# Patient Record
Sex: Male | Born: 1980 | Race: White | Hispanic: No | Marital: Married | Smoking: Current every day smoker
Health system: Southern US, Community
[De-identification: ages and names within clinical notes are randomized; demographics above are authoritative.]

## PROBLEM LIST (undated history)

## (undated) DIAGNOSIS — N189 Chronic kidney disease, unspecified: Secondary | ICD-10-CM

## (undated) DIAGNOSIS — A419 Sepsis, unspecified organism: Secondary | ICD-10-CM

## (undated) DIAGNOSIS — E119 Type 2 diabetes mellitus without complications: Secondary | ICD-10-CM

## (undated) DIAGNOSIS — Z87442 Personal history of urinary calculi: Secondary | ICD-10-CM

## (undated) HISTORY — DX: Chronic kidney disease, unspecified: N18.9

## (undated) HISTORY — DX: Type 2 diabetes mellitus without complications: E11.9

## (undated) HISTORY — PX: LEG TENDON SURGERY: SHX1004

---

## 2002-11-15 ENCOUNTER — Emergency Department (HOSPITAL_COMMUNITY): Admission: EM | Admit: 2002-11-15 | Discharge: 2002-11-15 | Payer: Self-pay | Admitting: Emergency Medicine

## 2002-11-15 ENCOUNTER — Encounter: Payer: Self-pay | Admitting: Emergency Medicine

## 2005-12-29 ENCOUNTER — Ambulatory Visit: Payer: Self-pay | Admitting: Family Medicine

## 2007-05-31 ENCOUNTER — Ambulatory Visit: Payer: Self-pay | Admitting: Family Medicine

## 2007-05-31 DIAGNOSIS — Z9189 Other specified personal risk factors, not elsewhere classified: Secondary | ICD-10-CM | POA: Insufficient documentation

## 2007-05-31 DIAGNOSIS — R509 Fever, unspecified: Secondary | ICD-10-CM

## 2007-05-31 DIAGNOSIS — J020 Streptococcal pharyngitis: Secondary | ICD-10-CM

## 2007-05-31 LAB — CONVERTED CEMR LAB: Rapid Strep: POSITIVE

## 2007-06-02 ENCOUNTER — Telehealth: Payer: Self-pay | Admitting: Family Medicine

## 2010-08-29 NOTE — Assessment & Plan Note (Signed)
[facility] OFFICE NOTE   Maurice Morales, Maurice Morales                      MRN:          [id number]  DATE:12/29/2005                            DOB:          1981-01-09    This is a 30 year old gentleman here to establish with the practice.  He is  also for a complete physical examination.  He has a couple of problems to  discuss.  First off, over the last couple of years, he has had some  stiffness and mild pain in a lot of his joints, especially his knees and  back when he gets up in the mornings.  After a few hours it loosens up and  really does not bother him during the day.  He is still quite active and  plays softball.  He played a lot of sports when he was younger, including  football.  He finds that occasionally taking Tylenol or ibuprofen helps.  He  does not think that he needs any type of medication on a daily basis,  however.  Also, he tends to have nosebleeds off and on.  This could be in  the winter time or in the summer time.  No other particular problems.   PAST MEDICAL HISTORY:  It has been many years since he has seen a doctor.  He had chicken pox as a child, but no other significant medical problems.  He has never had a surgery.   ALLERGIES:  None.   CURRENT MEDICATIONS:  None.   HABITS:  He quit smoking 6 months ago, but does continue to chew tobacco.  He drinks some alcohol.   SOCIAL HISTORY:  He is married with 2 children.  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   FAMILY HISTORY:  Remarkable for numerous family members, including  grandparents, an uncle, and his father with COPD (all of these were  smokers).  Also some family history of diabetes and heart disease.   OBJECTIVE:  Height 6 feet, 3 inches.  Weight 280, BP 120/70, pulse 60 and  regular.  GENERAL:  He is a little overweight.  SKIN:  Clear.  EYES:  Clear.  NOSE:  Clear.  OROPHARYNX:  Clear.  NECK:   Supple without lymphadenopathy or masses.  LUNGS:  Clear.  CARDIAC:  Rate and rhythm are regular without gallops, murmurs, or rubs.  Distal pulses full.  ABDOMEN:  Soft.  Normal bowel sounds.  Nontender.  No masses.  GENITALIA:  Normal male.  He is circumcised.  EXTREMITIES:  No cyanosis, clubbing, or edema.  NEUROLOGIC:  Grossly intact.   ASSESSMENT AND PLAN:  Problem #1.  Physical exam.  He is fasting so I will  get the usual laboratories.  Will also try to change his diet and lose some  weight.  Problem #2.  Mild degenerative joint pains.  Losing weight will help.  He  can continue to use ibuprofen as needed.  Problem #3.  Epistaxis, probably due to dry nasal passages.  I suggested  saline nasal sprays several times a day.  Tera Mater. Clent Ridges, MD   SAF/MedQ  DD:  12/29/2005  DT:  12/30/2005  Job #:  [id number]

## 2010-12-17 ENCOUNTER — Encounter: Payer: Self-pay | Admitting: Family Medicine

## 2010-12-17 ENCOUNTER — Ambulatory Visit (INDEPENDENT_AMBULATORY_CARE_PROVIDER_SITE_OTHER): Payer: Managed Care, Other (non HMO) | Admitting: Family Medicine

## 2010-12-17 VITALS — BP 134/86 | HR 81 | Temp 99.6°F | Ht 74.25 in | Wt 275.0 lb

## 2010-12-17 DIAGNOSIS — Z0000 Encounter for general adult medical examination without abnormal findings: Secondary | ICD-10-CM

## 2010-12-17 MED ORDER — CYCLOBENZAPRINE HCL 10 MG PO TABS: 10.0000 mg | ORAL_TABLET | Freq: Three times a day (TID) | ORAL | Status: AC | PRN

## 2010-12-17 MED ORDER — PREDNISONE (PAK) 10 MG PO TABS: ORAL_TABLET | ORAL | Status: DC

## 2010-12-17 NOTE — Progress Notes (Signed)
  Subjective:    Patient ID: Maurice Morales, male    DOB: 01/31/1981, 30 y.o.   MRN: [id number]  HPI 30 yr old male to re-establish with [place] and for a cpx. He has felt well except for some low back pain that he woke up with about 3 weeks ago. His job is very physical, and he has been splitting some firewood at home lately. No specific injuries that he knows of. The pain is sharp, in the lower right back, and it radiates down the right leg. No numbness or weakness. Sitting makes it worse, walking or lying down makes it better. Using heat and Motrin with no relief.    Review of Systems  Constitutional: Negative.   HENT: Negative.   Eyes: Negative.   Respiratory: Negative.   Cardiovascular: Negative.   Gastrointestinal: Negative.   Genitourinary: Negative.   Musculoskeletal: Positive for back pain. Negative for myalgias, joint swelling, arthralgias and gait problem.  Skin: Negative.   Neurological: Negative.   Hematological: Negative.   Psychiatric/Behavioral: Negative.        Objective:   Physical Exam  Constitutional: He is oriented to person, place, and time. He appears well-developed and well-nourished. No distress.  HENT:  Head: Normocephalic and atraumatic.  Right Ear: External ear normal.  Left Ear: External ear normal.  Nose: Nose normal.  Mouth/Throat: Oropharynx is clear and moist. No oropharyngeal exudate.  Eyes: Conjunctivae and EOM are normal. Pupils are equal, round, and reactive to light. Right eye exhibits no discharge. Left eye exhibits no discharge. No scleral icterus.  Neck: Neck supple. No JVD present. No tracheal deviation present. No thyromegaly present.  Cardiovascular: Normal rate, regular rhythm, normal heart sounds and intact distal pulses.  Exam reveals no gallop and no friction rub.   No murmur heard. Pulmonary/Chest: Effort normal and breath sounds normal. No respiratory distress. He has no wheezes. He has no rales. He exhibits no tenderness.  Abdominal:  Soft. Bowel sounds are normal. He exhibits no distension and no mass. There is no tenderness. There is no rebound and no guarding.  Genitourinary: Rectum normal, prostate normal and penis normal. Guaiac negative stool. No penile tenderness.  Musculoskeletal: Normal range of motion. He exhibits no edema and no tenderness.       Lower spine has full ROM and negative SLR  Lymphadenopathy:    He has no cervical adenopathy.  Neurological: He is alert and oriented to person, place, and time. He has normal reflexes. No cranial nerve deficit. He exhibits normal muscle tone. Coordination normal.  Skin: Skin is warm and dry. No rash noted. He is not diaphoretic. No erythema. No pallor.  Psychiatric: He has a normal mood and affect. His behavior is normal. Judgment and thought content normal.          Assessment & Plan:  He needs to quit smoking and lose some weight. He will return soon for fasting labs. Try a steroid dose pack and muscle relaxers for the back. Recheck prn

## 2010-12-25 ENCOUNTER — Other Ambulatory Visit (INDEPENDENT_AMBULATORY_CARE_PROVIDER_SITE_OTHER): Payer: Managed Care, Other (non HMO)

## 2010-12-25 DIAGNOSIS — Z0000 Encounter for general adult medical examination without abnormal findings: Secondary | ICD-10-CM

## 2010-12-25 LAB — CBC WITH DIFFERENTIAL/PLATELET
Eosinophils Absolute: 0 10*3/uL (ref 0.0–0.7)
HCT: 50.4 % (ref 39.0–52.0)
Hemoglobin: 16.4 g/dL (ref 13.0–17.0)
MCV: 94.1 fl (ref 78.0–100.0)
Monocytes Absolute: 0.9 10*3/uL (ref 0.1–1.0)
Monocytes Relative: 7.6 % (ref 3.0–12.0)
Neutrophils Relative %: 83.2 % — ABNORMAL HIGH (ref 43.0–77.0)
Platelets: 305 10*3/uL (ref 150.0–400.0)
RBC: 5.35 Mil/uL (ref 4.22–5.81)
RDW: 13.7 % (ref 11.5–14.6)
WBC: 11.3 10*3/uL — ABNORMAL HIGH (ref 4.5–10.5)

## 2010-12-25 LAB — TSH: TSH: 0.66 u[IU]/mL (ref 0.35–5.50)

## 2010-12-25 LAB — HEPATIC FUNCTION PANEL
ALT: 43 U/L (ref 0–53)
AST: 24 U/L (ref 0–37)
Alkaline Phosphatase: 85 U/L (ref 39–117)
Bilirubin, Direct: 0 mg/dL (ref 0.0–0.3)

## 2010-12-25 LAB — POCT URINALYSIS DIPSTICK
Bilirubin, UA: NEGATIVE
Glucose, UA: NEGATIVE
Leukocytes, UA: NEGATIVE
Protein, UA: NEGATIVE
Spec Grav, UA: 1.02
Urobilinogen, UA: 0.2

## 2010-12-25 LAB — BASIC METABOLIC PANEL
CO2: 31 mEq/L (ref 19–32)
Creatinine, Ser: 1.1 mg/dL (ref 0.4–1.5)
GFR: 84.87 mL/min (ref >60.00)
Glucose, Bld: 89 mg/dL (ref 70–99)

## 2010-12-25 LAB — LIPID PANEL
HDL: 44.3 mg/dL (ref >39.00)
Total CHOL/HDL Ratio: 4
VLDL: 12 mg/dL (ref 0.0–40.0)

## 2010-12-31 NOTE — Progress Notes (Signed)
Quick Note:  Pt aware ______ 

## 2013-03-03 ENCOUNTER — Ambulatory Visit (HOSPITAL_COMMUNITY): Payer: Managed Care, Other (non HMO) | Attending: Family Medicine

## 2013-03-03 ENCOUNTER — Ambulatory Visit (INDEPENDENT_AMBULATORY_CARE_PROVIDER_SITE_OTHER): Payer: Managed Care, Other (non HMO) | Admitting: Family Medicine

## 2013-03-03 ENCOUNTER — Encounter: Payer: Self-pay | Admitting: Family Medicine

## 2013-03-03 VITALS — BP 130/70 | HR 67 | Temp 98.3°F | Wt 280.0 lb

## 2013-03-03 DIAGNOSIS — M79609 Pain in unspecified limb: Secondary | ICD-10-CM

## 2013-03-03 DIAGNOSIS — R229 Localized swelling, mass and lump, unspecified: Secondary | ICD-10-CM

## 2013-03-03 DIAGNOSIS — S8010XA Contusion of unspecified lower leg, initial encounter: Secondary | ICD-10-CM

## 2013-03-03 DIAGNOSIS — M7989 Other specified soft tissue disorders: Secondary | ICD-10-CM | POA: Insufficient documentation

## 2013-03-03 DIAGNOSIS — X58XXXA Exposure to other specified factors, initial encounter: Secondary | ICD-10-CM | POA: Insufficient documentation

## 2013-03-03 DIAGNOSIS — S8011XA Contusion of right lower leg, initial encounter: Secondary | ICD-10-CM

## 2013-03-03 DIAGNOSIS — E669 Obesity, unspecified: Secondary | ICD-10-CM | POA: Insufficient documentation

## 2013-03-03 DIAGNOSIS — F17200 Nicotine dependence, unspecified, uncomplicated: Secondary | ICD-10-CM | POA: Insufficient documentation

## 2013-03-03 NOTE — Progress Notes (Signed)
  Subjective:    Patient ID: Maurice Morales, male    DOB: 1981/03/20, 32 y.o.   MRN: [id number]  HPI Here to check his right lower leg. He was struck there by a softball and it immediately swelled up. He still has a large hematoma at the site, but the entire lower leg is also swollen and painful. He has been up working on it every day. No chest pain or SOB.    Review of Systems  Constitutional: Negative.   Respiratory: Negative.   Cardiovascular: Positive for leg swelling. Negative for chest pain and palpitations.       Objective:   Physical Exam  Constitutional: He appears well-developed and well-nourished.  Musculoskeletal:  There is a large firm non-tender hematoma over the right shin. The entire right lower leg is mildly swollen and tender, especially over the calf. There is faint ecchymosis down the leg to the foot           Assessment & Plan:  He had a contusion causing a lot of subcutaneous bleeding. This has formed a hematoma and he probably had a slow leakage of blood for a week or so cauing the lower leg to swell. We will get a venous doppler to rule out a DVT today however.

## 2013-03-03 NOTE — Progress Notes (Signed)
Pre visit review using our clinic review tool, if applicable. No additional management support is needed unless otherwise documented below in the visit note. 

## 2013-05-19 ENCOUNTER — Ambulatory Visit (INDEPENDENT_AMBULATORY_CARE_PROVIDER_SITE_OTHER): Payer: Managed Care, Other (non HMO) | Admitting: Family Medicine

## 2013-05-19 ENCOUNTER — Encounter: Payer: Self-pay | Admitting: Family Medicine

## 2013-05-19 VITALS — BP 122/70 | HR 84 | Ht 74.25 in | Wt 272.0 lb

## 2013-05-19 DIAGNOSIS — M5442 Lumbago with sciatica, left side: Secondary | ICD-10-CM

## 2013-05-19 DIAGNOSIS — M5430 Sciatica, unspecified side: Secondary | ICD-10-CM

## 2013-05-19 MED ORDER — HYDROCODONE-ACETAMINOPHEN 10-325 MG PO TABS: 1.0000 | ORAL_TABLET | Freq: Four times a day (QID) | ORAL | Status: DC | PRN

## 2013-05-19 MED ORDER — DICLOFENAC SODIUM 75 MG PO TBEC: 75.0000 mg | DELAYED_RELEASE_TABLET | Freq: Two times a day (BID) | ORAL | Status: DC

## 2013-05-19 MED ORDER — CYCLOBENZAPRINE HCL 10 MG PO TABS: 10.0000 mg | ORAL_TABLET | Freq: Three times a day (TID) | ORAL | Status: DC | PRN

## 2013-05-19 NOTE — Progress Notes (Signed)
Pre visit review using our clinic review tool, if applicable. No additional management support is needed unless otherwise documented below in the visit note. 

## 2013-05-19 NOTE — Progress Notes (Signed)
   Subjective:    Patient ID: Maurice Morales, male    DOB: Oct 01, 1980, 33 y.o.   MRN: [id number]  HPI Here for 2 months of sharp pains in the left lower back that shoot through the left buttock down to the left knee. His left foot and leg get numb at times. No hx of trauma. He notes that his mother and sister both developed degenerative disc problems in their 8440s. He has tried heat and Aleve.    Review of Systems  Constitutional: Negative.   Musculoskeletal: Positive for back pain.       Objective:   Physical Exam  Constitutional: He appears well-developed and well-nourished.  Musculoskeletal:  Tender in the left lower back and over the left sciatic notch. Full ROM of the spine, negative SLR          Assessment & Plan:  Probable degenerative disc disease. Start on Diclofenac and Flexeril, use Norco prn. Suggested he see a chiropractor

## 2013-05-22 ENCOUNTER — Telehealth: Payer: Self-pay | Admitting: Family Medicine

## 2013-05-22 NOTE — Telephone Encounter (Signed)
Relevant patient education mailed to patient.  

## 2014-08-11 ENCOUNTER — Encounter: Payer: Self-pay | Admitting: Internal Medicine

## 2014-08-11 ENCOUNTER — Ambulatory Visit (INDEPENDENT_AMBULATORY_CARE_PROVIDER_SITE_OTHER): Payer: Managed Care, Other (non HMO) | Admitting: Internal Medicine

## 2014-08-11 VITALS — BP 110/80 | Temp 98.8°F | Wt 283.0 lb

## 2014-08-11 DIAGNOSIS — S86911A Strain of unspecified muscle(s) and tendon(s) at lower leg level, right leg, initial encounter: Secondary | ICD-10-CM

## 2014-08-11 NOTE — Assessment & Plan Note (Signed)
No evidence of ligament or meniscus injury I suspect underlying arthritis Discussed NSAIDs before playing, quad strengthening, etc

## 2014-08-11 NOTE — Patient Instructions (Signed)
Please continue aleve 2 twice a day until the pain is better. Ice at the end of the day when stiffness comes back Work on Dance movement psychotherapistquad strengthening

## 2014-08-11 NOTE — Progress Notes (Signed)
Pre visit review using our clinic review tool, if applicable. No additional management support is needed unless otherwise documented below in the visit note. 

## 2014-08-11 NOTE — Progress Notes (Signed)
   Subjective:    Patient ID: Maurice Morales, male    DOB: 1980-05-05, 34 y.o.   MRN: [id number]  HPI Here due to right knee pain  5 nights ago after softball game--drove 30 miles and noted the knee locked when he got up Since then--it is tight every morning Hard to walk on it and worsens as the day goes on Very painful  Doesn't remember any injury--but did do a lot of running that night  Tried knee brace--bars on side with velcro Tried heat patches--doesn't seem to have helped Iced at night 2 aleve daily and ibuprofen 400mg  bid  Does feel better keeping his feet up  No current outpatient prescriptions on file prior to visit.   No current facility-administered medications on file prior to visit.    No Known Allergies  No past medical history on file.  No past surgical history on file.  Family History  Problem Relation Age of Onset  . COPD Father   . COPD Paternal Uncle   . Diabetes Maternal Grandmother   . COPD Paternal Grandfather     History   Social History  . Marital Status: Married    Spouse Name: N/A  . Number of Children: N/A  . Years of Education: N/A   Occupational History  . Not on file.   Social History Main Topics  . Smoking status: Current Every Day Smoker -- 0.50 packs/day for 10 years    Types: Cigarettes  . Smokeless tobacco: Never Used  . Alcohol Use: 0.0 oz/week     Comment: occ  . Drug Use: No  . Sexual Activity: Not on file   Other Topics Concern  . Not on file   Social History Narrative   Review of Systems No fever Doesn't feel sick but just some mild sinus symptoms    Objective:   Physical Exam  Constitutional: He appears well-developed and well-nourished. No distress.  Musculoskeletal:  No sig right knee effusion No ligament findings MacMurray's negative Sig crepitus with ROM  Neurological:  Stiff legged in right leg but full weight bearing with gait          Assessment & Plan:

## 2016-09-10 ENCOUNTER — Telehealth: Payer: Self-pay | Admitting: Family Medicine

## 2016-09-10 NOTE — Telephone Encounter (Signed)
Pt last seen on 05-2013 and would like to re-est . Pt is having back pain

## 2016-09-11 NOTE — Telephone Encounter (Signed)
Yes I can see him again  

## 2016-09-11 NOTE — Telephone Encounter (Signed)
lmom for pt to call back

## 2016-09-15 NOTE — Telephone Encounter (Signed)
lmom for pt to call back

## 2016-09-17 NOTE — Telephone Encounter (Signed)
lmom for pt to call back

## 2017-05-06 ENCOUNTER — Telehealth: Payer: Self-pay | Admitting: Family Medicine

## 2017-05-06 NOTE — Telephone Encounter (Signed)
Pt wife was in today to see Dr. Clent RidgesFry and wanted to know if Dr. Clent RidgesFry would consider seeing her husband again?  She is well aware that Dr. Clent RidgesFry is not taking newpt.

## 2017-05-06 NOTE — Telephone Encounter (Signed)
Yes I can see him again  

## 2017-05-06 NOTE — Telephone Encounter (Signed)
Sent to PCP for consideration

## 2017-05-07 NOTE — Telephone Encounter (Signed)
calle dboth pt and wife to advise that Dr. Clent RidgesFry will take on Pjilip as a new pt.

## 2017-06-01 ENCOUNTER — Encounter: Payer: Self-pay | Admitting: Family Medicine

## 2017-06-01 ENCOUNTER — Ambulatory Visit: Payer: Managed Care, Other (non HMO) | Admitting: Family Medicine

## 2017-06-01 ENCOUNTER — Ambulatory Visit (INDEPENDENT_AMBULATORY_CARE_PROVIDER_SITE_OTHER)
Admission: RE | Admit: 2017-06-01 | Discharge: 2017-06-01 | Disposition: A | Discharge status: Home / Self Care | Payer: Managed Care, Other (non HMO) | Source: Ambulatory Visit | Attending: Family Medicine | Admitting: Family Medicine

## 2017-06-01 VITALS — BP 124/82 | HR 81 | Temp 99.2°F | Ht 73.5 in | Wt 284.2 lb

## 2017-06-01 DIAGNOSIS — M546 Pain in thoracic spine: Secondary | ICD-10-CM

## 2017-06-01 DIAGNOSIS — G8929 Other chronic pain: Secondary | ICD-10-CM

## 2017-06-01 DIAGNOSIS — M545 Low back pain, unspecified: Secondary | ICD-10-CM

## 2017-06-01 DIAGNOSIS — M542 Cervicalgia: Secondary | ICD-10-CM

## 2017-06-01 DIAGNOSIS — Z0000 Encounter for general adult medical examination without abnormal findings: Secondary | ICD-10-CM | POA: Diagnosis not present

## 2017-06-01 DIAGNOSIS — Z0001 Encounter for general adult medical examination with abnormal findings: Secondary | ICD-10-CM | POA: Diagnosis not present

## 2017-06-01 DIAGNOSIS — R739 Hyperglycemia, unspecified: Secondary | ICD-10-CM | POA: Diagnosis not present

## 2017-06-01 LAB — HEPATIC FUNCTION PANEL
ALT: 55 U/L — AB (ref 0–53)
AST: 31 U/L (ref 0–37)
Albumin: 4.3 g/dL (ref 3.5–5.2)
Alkaline Phosphatase: 90 U/L (ref 39–117)
Bilirubin, Direct: 0.1 mg/dL (ref 0.0–0.3)
TOTAL PROTEIN: 7.3 g/dL (ref 6.0–8.3)
Total Bilirubin: 0.6 mg/dL (ref 0.2–1.2)

## 2017-06-01 LAB — CBC WITH DIFFERENTIAL/PLATELET
BASOS ABS: 0 10*3/uL (ref 0.0–0.1)
Basophils Relative: 0.5 % (ref 0.0–3.0)
Eosinophils Absolute: 0.2 10*3/uL (ref 0.0–0.7)
Eosinophils Relative: 2.4 % (ref 0.0–5.0)
HCT: 48 % (ref 39.0–52.0)
HEMOGLOBIN: 16.1 g/dL (ref 13.0–17.0)
Lymphocytes Relative: 18.3 % (ref 12.0–46.0)
Lymphs Abs: 1.7 10*3/uL (ref 0.7–4.0)
MCHC: 33.6 g/dL (ref 30.0–36.0)
MCV: 90.9 fl (ref 78.0–100.0)
MONO ABS: 0.9 10*3/uL (ref 0.1–1.0)
Monocytes Relative: 9.8 % (ref 3.0–12.0)
Neutro Abs: 6.4 10*3/uL (ref 1.4–7.7)
Neutrophils Relative %: 69 % (ref 43.0–77.0)
PLATELETS: 307 10*3/uL (ref 150.0–400.0)
RBC: 5.28 Mil/uL (ref 4.22–5.81)
RDW: 13.4 % (ref 11.5–15.5)
WBC: 9.3 10*3/uL (ref 4.0–10.5)

## 2017-06-01 LAB — LDL CHOLESTEROL, DIRECT: LDL DIRECT: 128 mg/dL

## 2017-06-01 LAB — BASIC METABOLIC PANEL
BUN: 17 mg/dL (ref 6–23)
CALCIUM: 9.7 mg/dL (ref 8.4–10.5)
CO2: 29 meq/L (ref 19–32)
Chloride: 100 mEq/L (ref 96–112)
Creatinine, Ser: 1.09 mg/dL (ref 0.40–1.50)
GFR: 80.09 mL/min (ref >60.00)
GLUCOSE: 139 mg/dL — AB (ref 70–99)
Potassium: 4.2 mEq/L (ref 3.5–5.1)
Sodium: 137 mEq/L (ref 135–145)

## 2017-06-01 LAB — LIPID PANEL
CHOLESTEROL: 193 mg/dL (ref 0–200)
HDL: 29.4 mg/dL — AB (ref >39.00)
NonHDL: 163.12
Total CHOL/HDL Ratio: 7
Triglycerides: 311 mg/dL — ABNORMAL HIGH (ref 0.0–149.0)
VLDL: 62.2 mg/dL — ABNORMAL HIGH (ref 0.0–40.0)

## 2017-06-01 LAB — POC URINALSYSI DIPSTICK (AUTOMATED)
Bilirubin, UA: NEGATIVE
Glucose, UA: NEGATIVE
Ketones, UA: NEGATIVE
Leukocytes, UA: NEGATIVE
Nitrite, UA: NEGATIVE
RBC UA: NEGATIVE
Spec Grav, UA: >=1.03 — AB (ref 1.010–1.025)
Urobilinogen, UA: 0.2 E.U./dL
pH, UA: 6 (ref 5.0–8.0)

## 2017-06-01 LAB — TSH: TSH: 2.14 u[IU]/mL (ref 0.35–4.50)

## 2017-06-01 MED ORDER — DICLOFENAC SODIUM 75 MG PO TBEC
75.0000 mg | DELAYED_RELEASE_TABLET | Freq: Two times a day (BID) | ORAL | 2 refills | Status: DC
Start: 2017-06-01 — End: 2017-07-05

## 2017-06-01 MED ORDER — CYCLOBENZAPRINE HCL 10 MG PO TABS: 10.0000 mg | ORAL_TABLET | Freq: Three times a day (TID) | ORAL | 2 refills | Status: DC | PRN

## 2017-06-01 NOTE — Progress Notes (Signed)
   Subjective:    Patient ID: Maurice Morales, male    DOB: 1980/06/22, 37 y.o.   MRN: [id number]  HPI Here to re-establish and for a well exam. He feels well except for nagging stiffness and pain in the neck and back that started about 5 years ago. He is very stiff when he gets up in the morning, and his back pops and cracks until he loosens up. The pain runs from the neck all the way down to the lower back. No radiation to the arms or legs. He takes Ibuprofen at times.    Review of Systems  Constitutional: Negative.   HENT: Negative.   Eyes: Negative.   Respiratory: Negative.   Cardiovascular: Negative.   Gastrointestinal: Negative.   Genitourinary: Negative.   Musculoskeletal: Positive for back pain, neck pain and neck stiffness. Negative for arthralgias and joint swelling.  Skin: Negative.   Neurological: Negative.   Psychiatric/Behavioral: Negative.        Objective:   Physical Exam  Constitutional: He is oriented to person, place, and time. He appears well-developed and well-nourished. No distress.  HENT:  Head: Normocephalic and atraumatic.  Right Ear: External ear normal.  Left Ear: External ear normal.  Nose: Nose normal.  Mouth/Throat: Oropharynx is clear and moist. No oropharyngeal exudate.  Eyes: Conjunctivae and EOM are normal. Pupils are equal, round, and reactive to light. Right eye exhibits no discharge. Left eye exhibits no discharge. No scleral icterus.  Neck: Neck supple. No JVD present. No tracheal deviation present. No thyromegaly present.  Cardiovascular: Normal rate, regular rhythm, normal heart sounds and intact distal pulses. Exam reveals no gallop and no friction rub.  No murmur heard. Pulmonary/Chest: Effort normal and breath sounds normal. No respiratory distress. He has no wheezes. He has no rales. He exhibits no tenderness.  Abdominal: Soft. Bowel sounds are normal. He exhibits no distension and no mass. There is no tenderness. There is no rebound and  no guarding.  Genitourinary: Rectum normal, prostate normal and penis normal. Rectal exam shows guaiac negative stool. No penile tenderness.  Musculoskeletal: Normal range of motion. He exhibits no edema or tenderness.  Lymphadenopathy:    He has no cervical adenopathy.  Neurological: He is alert and oriented to person, place, and time. He has normal reflexes. No cranial nerve deficit. He exhibits normal muscle tone. Coordination normal.  Skin: Skin is warm and dry. No rash noted. He is not diaphoretic. No erythema. No pallor.  Psychiatric: He has a normal mood and affect. His behavior is normal. Judgment and thought content normal.          Assessment & Plan:  Well exam. We discussed diet and exercise get fasting labs. For the back pain we will set up Xrays of the entire spine. He will try Diclofenac and Flexeril for pain relief. Gershon CraneStephen Elesha Thedford, MD

## 2017-06-02 NOTE — Addendum Note (Signed)
Addended by: Gershon CraneFRY, Bhavya Eschete A on: 06/02/2017 08:14 AM   Modules accepted: Orders

## 2017-06-03 ENCOUNTER — Telehealth: Payer: Self-pay | Admitting: Family Medicine

## 2017-06-03 LAB — HEMOGLOBIN A1C: Hgb A1c MFr Bld: 6.5 % (ref 4.6–6.5)

## 2017-06-03 NOTE — Telephone Encounter (Signed)
Pt given results per Dr Gershon CraneStephen Fry, " Normal except his cholesterol is high and he appears to have type 2 diabetes. He needs to have an A1c to confirm. Please call the lab to see if they can run this off the sample from yesterday (I will put in the order) or else he can come back to be drawn"; spoke with Nettie ElmSylvia at [facility] and she confirms that the lab could be obtained from samples collected on 06/01/17; pt verbalizes understanding and states that [facility] [place]; pt an be contacted at [phone]; unable to chart in result note because not routed to [facility] pool.

## 2017-06-03 NOTE — Addendum Note (Signed)
Addended by: Charna ElizabethLEMMONS, STEPHANIE L on: 06/03/2017 10:18 AM   Modules accepted: Orders

## 2017-06-16 ENCOUNTER — Telehealth: Payer: Self-pay | Admitting: Family Medicine

## 2017-06-16 DIAGNOSIS — M542 Cervicalgia: Secondary | ICD-10-CM

## 2017-06-16 DIAGNOSIS — M546 Pain in thoracic spine: Secondary | ICD-10-CM

## 2017-06-16 DIAGNOSIS — M5440 Lumbago with sciatica, unspecified side: Secondary | ICD-10-CM

## 2017-06-16 DIAGNOSIS — G8929 Other chronic pain: Secondary | ICD-10-CM

## 2017-06-16 NOTE — Telephone Encounter (Signed)
I recommend he see a Sports Medicine specialist. If he wishes I can set up a referral

## 2017-06-16 NOTE — Telephone Encounter (Signed)
Copied from CRM [id number]. Topic: General - Other >> Jun 16, 2017 10:32 AM Cecelia ByarsGreen, Temeka L, RMA wrote: Reason for CRM: Patient is calling to notify Dr. Clent RidgesFry that medication prescribed diclofenac (VOLTAREN) 75 MG EC tablet is not working and would like a call back concerning what he should do

## 2017-06-16 NOTE — Telephone Encounter (Signed)
Sent to PCP to advise 

## 2017-06-16 NOTE — Telephone Encounter (Signed)
Called pt and left a detailed VM to call back if they wish for referral to be set up.

## 2017-06-17 NOTE — Telephone Encounter (Signed)
Sent to PCP to set up referral to see Sports Medicine specialist.   Copied from CRM [phone]. Topic: Referral - Request >> Jun 16, 2017  2:58 PM Maurice Morales, Maurice Morales wrote: Reason for CRM: Patient returned [name]'s call concerning the referral. Patient states that he would like the referral placed.       Thank You!!!

## 2017-06-17 NOTE — Telephone Encounter (Signed)
The referral was done  

## 2017-06-18 NOTE — Telephone Encounter (Signed)
Called and spoke with pt. Pt advised and voiced understanding.  

## 2017-06-28 ENCOUNTER — Ambulatory Visit: Payer: Managed Care, Other (non HMO) | Admitting: Sports Medicine

## 2017-07-01 ENCOUNTER — Ambulatory Visit: Payer: Managed Care, Other (non HMO) | Admitting: Sports Medicine

## 2017-07-05 ENCOUNTER — Ambulatory Visit (INDEPENDENT_AMBULATORY_CARE_PROVIDER_SITE_OTHER): Payer: Managed Care, Other (non HMO) | Admitting: Sports Medicine

## 2017-07-05 ENCOUNTER — Encounter: Payer: Self-pay | Admitting: Sports Medicine

## 2017-07-05 VITALS — BP 146/83 | Ht 75.0 in | Wt 280.0 lb

## 2017-07-05 DIAGNOSIS — G8929 Other chronic pain: Secondary | ICD-10-CM | POA: Diagnosis not present

## 2017-07-05 DIAGNOSIS — M549 Dorsalgia, unspecified: Secondary | ICD-10-CM | POA: Diagnosis not present

## 2017-07-05 MED ORDER — MELOXICAM 15 MG PO TABS: ORAL_TABLET | ORAL | 0 refills | Status: DC

## 2017-07-05 NOTE — Assessment & Plan Note (Signed)
This back pain is likely due in part to the mild arthritis demonstrated on spinal xrays. 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 He also has a rounded shoulder posture which would be exacerbated by looking downward for machine operation. We will try meloxicam for symptom relief as diclofenac was ineffective and provide exercise instructions for scapular stabilization. He should follow up in 4-6 weeks, if symptoms are not improving with conservative care by that time we can discuss MRI for possible ESI or formal PT.

## 2017-07-05 NOTE — Progress Notes (Signed)
HPI  Maurice Morales is here today with several years of upper back pain that he discussed with his PCP Dr. Clent Ridges who referred him for Sports Medicine evaluation. This back pain is localized from the mid back to below the neck. He describes feeling like a tight knot that is relieved with stretching and popping his neck and back. 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 He was prescribed diclofenac and flexeril and felt no benefit with the diclofenac but relief helping him sleep through the night with the flexeril. 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 He also has intermittent low back pain that can radiate to his left knee but never further and does not persist. He denies any problems in his arms or with weakness or numbness. He does not recall any previous trauma or injury.  CC: Upper back pain  Medications/Interventions Tried: diclofenac, flexeril  See HPI and/or previous note for associated ROS.  Family History  Problem Relation Age of Onset  . COPD Father   . COPD Paternal Uncle   . Diabetes Maternal Grandmother   . COPD Paternal Grandfather    Social History   Socioeconomic History  . Marital status: Married    Spouse name: Not on file  . Number of children: Not on file  . Years of education: Not on file  . Highest education level: Not on file  Occupational History  . Forklift operator  Tobacco Use  . Smoking status: Current Every Day Smoker    Packs/day: 0.50    Years: 10.00    Pack years: 5.00    Types: Cigarettes  . Smokeless tobacco: Never Used     Objective: BP (!) 146/83   Ht 6\' 3"  (1.905 m)   Wt 280 lb (127 kg)   BMI 35.00 kg/m  Gen: Right-Hand Dominant. NAD, well  groomed, normal affect.  CV: Well-perfused. Warm upper extremities. Resp: Non-labored.  Neuro: Sensation intact throughout. No gross coordination deficits. Patellar and achilles reflexes are 2/4 b/l. Gait: Shoulders rounded at resting posture, unremarkable stride without signs of limp or balance issues. MSK: Upper extremity strength is 5/5 bilaterally, mid back pain between the scapulae is produced on shoulder flexion against resistance and direct palpation, there is no pain radiation or numbness in either arm, neck ROM is normal without pain There is no low back pain tenderness to palpation, straight leg raise is negative, hip ROM is normal b/l  Assessment and plan:  Chronic upper back pain This back pain is likely due in part to the mild arthritis demonstrated on spinal xrays. 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 He also has a rounded shoulder posture which would be exacerbated by looking downward for machine operation. We will try meloxicam for symptom relief as diclofenac was ineffective and provide exercise instructions for scapular stabilization. He should follow up in 4-6 weeks, if symptoms are not improving with conservative care by that time we can discuss MRI for possible ESI or formal PT.   No orders of the defined types were placed in this encounter.   Meds ordered this encounter  Medications  . meloxicam (MOBIC) 15 MG tablet    Sig: Take 1 tablet daily with food for  7 days. Then take as needed.    Dispense:  40 tablet    Refill:  0   Fuller Planhristopher W Tabby Beaston, MD PGY-III Internal Medicine Resident 07/05/2017, 2:48 PM  Patient seen and evaluated with the resident. I agree with the above plan of care. Patient's chronic mid back pain is likely a combination of mild degenerative disc disease, occupation, and posture. Since he is not receiving any benefit from diclofenac, we will change his anti-inflammatory to meloxicam. He'll also start  scapular stabilization exercises and follow-up with me in 4-6 weeks. If symptoms persist then consider further diagnostic imaging potentially in the form of an MRI. Call with questions or concerns prior to his follow-up visit.

## 2017-07-15 ENCOUNTER — Telehealth: Payer: Self-pay | Admitting: Sports Medicine

## 2017-07-15 NOTE — Telephone Encounter (Signed)
The medication for arthritis in his back is not working, just [employer], thanks.

## 2017-07-23 ENCOUNTER — Ambulatory Visit (INDEPENDENT_AMBULATORY_CARE_PROVIDER_SITE_OTHER): Payer: Managed Care, Other (non HMO) | Admitting: Family Medicine

## 2017-07-23 ENCOUNTER — Encounter: Payer: Self-pay | Admitting: Family Medicine

## 2017-07-23 VITALS — BP 120/82 | HR 81 | Temp 98.8°F | Ht 75.0 in | Wt 287.2 lb

## 2017-07-23 DIAGNOSIS — M722 Plantar fascial fibromatosis: Secondary | ICD-10-CM | POA: Diagnosis not present

## 2017-07-23 MED ORDER — METHYLPREDNISOLONE 4 MG PO TBPK: ORAL_TABLET | ORAL | 0 refills | Status: DC

## 2017-07-23 MED ORDER — CYCLOBENZAPRINE HCL 10 MG PO TABS: 10.0000 mg | ORAL_TABLET | Freq: Three times a day (TID) | ORAL | 5 refills | Status: DC | PRN

## 2017-07-23 NOTE — Progress Notes (Signed)
   Subjective:    Patient ID: Maurice Morales, male    DOB: July 21, 1980, 37 y.o.   MRN: [id number]  HPI Here for 6 days of pain in the bottom of the left heel. He woke up with this pain one morning. No recent trauma. Ice does not help. Aleve helps a little.    Review of Systems  Constitutional: Negative.   Respiratory: Negative.   Cardiovascular: Negative.   Musculoskeletal: Positive for myalgias.       Objective:   Physical Exam  Constitutional: He appears well-developed and well-nourished.  Cardiovascular: Normal rate, regular rhythm, normal heart sounds and intact distal pulses.  Pulmonary/Chest: Effort normal and breath sounds normal. No respiratory distress. He has no wheezes. He has no rales.  Musculoskeletal:  The bottom of the left heel and most of the arch is tender. No swelling          Assessment & Plan:  Plantar fasciitis. Wear gel arch supports as much as possible. Given a Medrol dose pack.  Gershon CraneStephen Fry, MD

## 2017-08-09 ENCOUNTER — Encounter: Payer: Self-pay | Admitting: Sports Medicine

## 2017-08-09 ENCOUNTER — Ambulatory Visit (INDEPENDENT_AMBULATORY_CARE_PROVIDER_SITE_OTHER): Payer: Managed Care, Other (non HMO) | Admitting: Sports Medicine

## 2017-08-09 VITALS — BP 130/90 | Ht 75.0 in | Wt 284.0 lb

## 2017-08-09 DIAGNOSIS — G8929 Other chronic pain: Secondary | ICD-10-CM | POA: Diagnosis not present

## 2017-08-09 DIAGNOSIS — M549 Dorsalgia, unspecified: Secondary | ICD-10-CM

## 2017-08-09 MED ORDER — MELOXICAM 15 MG PO TABS: 15.0000 mg | ORAL_TABLET | Freq: Every day | ORAL | 1 refills | Status: DC | PRN

## 2017-08-09 NOTE — Progress Notes (Signed)
   Subjective:    Patient ID: JAAMAL Morales, male    DOB: [birth year], 37 y.o.   MRN: [id number]  HPI   Maurice Morales comes in today for follow-up on back pain. He's noticed some improvement with meloxicam but still has pain and tension at the end of the day and at night while sleeping. Flexeril has been somewhat helpful. He also endorses some intermittent pain down the left leg. Previous x-rays showed mild degenerative changes in the lumbar spine. Fairly unremarkable cervical and thoracic films.    Review of Systems    as above Objective:   Physical Exam  Well-developed, well-nourished. No acute distress  There is no tenderness to palpation along the thoracic midline or paraspinal musculatures. No spasm. No gross focal neurological deficit of either upper or lower extremities.      Assessment & Plan:   Chronic back pain  Patient's job is definitely contribute into his symptoms. He has a very physical job. I would like to try some formal physical therapy. He is amendable to this. I will also refill his meloxicam and he may continue with his Flexeril as needed. Follow-up with me again in 4 weeks. We may consider further diagnostic imaging if he does not notice any initial improvement with PT.

## 2018-02-23 ENCOUNTER — Ambulatory Visit (INDEPENDENT_AMBULATORY_CARE_PROVIDER_SITE_OTHER): Payer: Managed Care, Other (non HMO) | Admitting: Family Medicine

## 2018-02-23 ENCOUNTER — Encounter: Payer: Self-pay | Admitting: Family Medicine

## 2018-02-23 VITALS — BP 136/84 | HR 90 | Temp 98.8°F | Wt 289.4 lb

## 2018-02-23 DIAGNOSIS — M542 Cervicalgia: Secondary | ICD-10-CM | POA: Diagnosis not present

## 2018-02-23 DIAGNOSIS — M5410 Radiculopathy, site unspecified: Secondary | ICD-10-CM

## 2018-02-23 MED ORDER — TRAMADOL HCL 50 MG PO TABS: 100.0000 mg | ORAL_TABLET | Freq: Four times a day (QID) | ORAL | 1 refills | Status: DC | PRN

## 2018-02-23 NOTE — Progress Notes (Signed)
   Subjective:    Patient ID: Maurice Morales, male    DOB: Apr 22, 1980, 37 y.o.   MRN: [id number]  HPI Here for 5 weeks of intermittent sharp pains in the right upper back that radiate to the right shoulder and down the right arm. No hx of trauma. He had seen Dr. Margaretha Sheffieldraper in March for a similar pain and he started him on Meloxicam. This helped a little at first but not now. Sleep is difficult for him due to the pain keeping him awake. He does take a Flexeril at bedtime. Plain films of the thoracic and cervical spines in February were unremarkable.    Review of Systems  Constitutional: Negative.   Respiratory: Negative.   Cardiovascular: Negative.   Musculoskeletal: Positive for back pain.  Neurological: Positive for numbness. Negative for weakness.       Objective:   Physical Exam  Constitutional: He is oriented to person, place, and time. He appears well-developed and well-nourished.  Cardiovascular: Normal rate, regular rhythm, normal heart sounds and intact distal pulses.  Pulmonary/Chest: Effort normal and breath sounds normal.  Musculoskeletal:  He is tender along the right upper back just superior to the scapula, ROM is full   Neurological: He is alert and oriented to person, place, and time.          Assessment & Plan:  Thoracic back pain with radiculopathy. We will set up MRI scans of the thoracic and cervical spines. He can use Tramadol for pain prn.  Gershon CraneStephen Fry, MD

## 2018-02-24 ENCOUNTER — Other Ambulatory Visit: Payer: Self-pay | Admitting: Family Medicine

## 2018-02-24 DIAGNOSIS — T1590XA Foreign body on external eye, part unspecified, unspecified eye, initial encounter: Secondary | ICD-10-CM

## 2018-03-01 ENCOUNTER — Ambulatory Visit: Payer: Self-pay | Admitting: Nurse Practitioner

## 2018-03-08 ENCOUNTER — Ambulatory Visit
Admission: RE | Admit: 2018-03-08 | Discharge: 2018-03-08 | Disposition: A | Discharge status: Home / Self Care | Payer: Managed Care, Other (non HMO) | Source: Ambulatory Visit | Attending: Family Medicine | Admitting: Family Medicine

## 2018-03-08 DIAGNOSIS — M5410 Radiculopathy, site unspecified: Secondary | ICD-10-CM

## 2018-03-08 DIAGNOSIS — T1590XA Foreign body on external eye, part unspecified, unspecified eye, initial encounter: Secondary | ICD-10-CM

## 2018-03-08 DIAGNOSIS — M542 Cervicalgia: Secondary | ICD-10-CM

## 2018-03-14 ENCOUNTER — Telehealth: Payer: Self-pay | Admitting: *Deleted

## 2018-03-14 DIAGNOSIS — M5020 Other cervical disc displacement, unspecified cervical region: Secondary | ICD-10-CM

## 2018-03-14 NOTE — Telephone Encounter (Signed)
Copied from CRM [phone]. Topic: General - Other >> Mar 09, 2018  2:57 PM Marylen PontoMcneil, Ja-Kwan wrote: Reason for CRM: Pt returned call to office. Pt requests call back. Cb# [phone] >> Mar 14, 2018 10:17 AM Floria RavelingStovall, Shana A wrote: Pt called back to speak with Dr Clent RidgesFry Nurse about his MRI results.  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 I suggest he see Neurosurgery. If he agrees, I will do a referral  Called and spoke with pt and he would like the referral to be placed.  He also stated that the tramadol has not touched his pain and he wanted to see if he could get something different.  Dr. Clent RidgesFry please advise. Thanks

## 2018-03-15 NOTE — Telephone Encounter (Signed)
Dr. Fry please advise. Thanks  

## 2018-03-15 NOTE — Telephone Encounter (Signed)
Pt calling Dr Clent RidgesFry assistant back from message she took yesterday patient states that the tramadol has not touched his pain and he wanted to see if he could get something different. Please call pt back at [phone]

## 2018-03-16 MED ORDER — HYDROCODONE-ACETAMINOPHEN 5-325 MG PO TABS: 1.0000 | ORAL_TABLET | ORAL | 0 refills | Status: AC | PRN

## 2018-03-16 NOTE — Telephone Encounter (Signed)
I called the pt and he is aware of the pain meds that have been sent to the pharmacy.  Referral has been placed and pt is aware.

## 2018-03-16 NOTE — Telephone Encounter (Signed)
I sent in some Norco

## 2018-03-21 ENCOUNTER — Telehealth: Payer: Self-pay | Admitting: Family Medicine

## 2018-03-21 NOTE — Telephone Encounter (Signed)
Copied from CRM [phone]. Topic: Quick Communication - See Telephone Encounter >> Mar 21, 2018  3:02 PM Jens SomMedley, Jennifer A wrote: CRM for notification. See Telephone encounter for: 03/21/18.  Patient is  calling because the HYDROcodone-acetaminophen (NORCO) 5-325 MG tablet [[id number]][[id number]]  is not strong enough Patient is still having strong discomfort.  He has not seen the neurosurgeon yet.  The neurosurgeon contact information was released to him today. Please advise [phone]

## 2018-03-24 NOTE — Telephone Encounter (Signed)
Dr Fry please advise. thanks 

## 2018-03-25 MED ORDER — OXYCODONE-ACETAMINOPHEN 10-325 MG PO TABS: 1.0000 | ORAL_TABLET | ORAL | 0 refills | Status: DC | PRN

## 2018-03-25 NOTE — Telephone Encounter (Signed)
I sent in some Percocet. 

## 2018-06-20 ENCOUNTER — Telehealth: Payer: Self-pay | Admitting: *Deleted

## 2018-06-20 NOTE — Telephone Encounter (Signed)
Copied from CRM [id number]. Topic: General - Other >> Mar 09, 2018  2:57 PM Marylen Ponto wrote: Reason for CRM: Pt returned call to office. Pt requests call back. Cb# [phone] >> Mar 14, 2018 10:17 AM Floria Raveling A wrote: Pt called back to speak with Dr Clent Ridges Nurse about his MRI results.  Please advise   Best number [phone]   Completed CRM

## 2018-06-22 ENCOUNTER — Other Ambulatory Visit: Payer: Self-pay | Admitting: Neurological Surgery

## 2018-06-22 DIAGNOSIS — M545 Low back pain, unspecified: Secondary | ICD-10-CM

## 2018-09-06 ENCOUNTER — Other Ambulatory Visit: Payer: Self-pay

## 2018-09-06 ENCOUNTER — Ambulatory Visit
Admission: RE | Admit: 2018-09-06 | Discharge: 2018-09-06 | Disposition: A | Discharge status: Home / Self Care | Payer: Managed Care, Other (non HMO) | Source: Ambulatory Visit | Attending: Neurological Surgery | Admitting: Neurological Surgery

## 2018-09-06 DIAGNOSIS — M545 Low back pain, unspecified: Secondary | ICD-10-CM

## 2019-03-22 IMAGING — DX DG CERVICAL SPINE COMPLETE 4+V
6 series · 6 of 6 positions shown · non-contrast
Comparison: No recent.

CLINICAL DATA: Neck and back pain.

EXAM:
CERVICAL SPINE - COMPLETE 4+ VIEW

[c-spine lat]
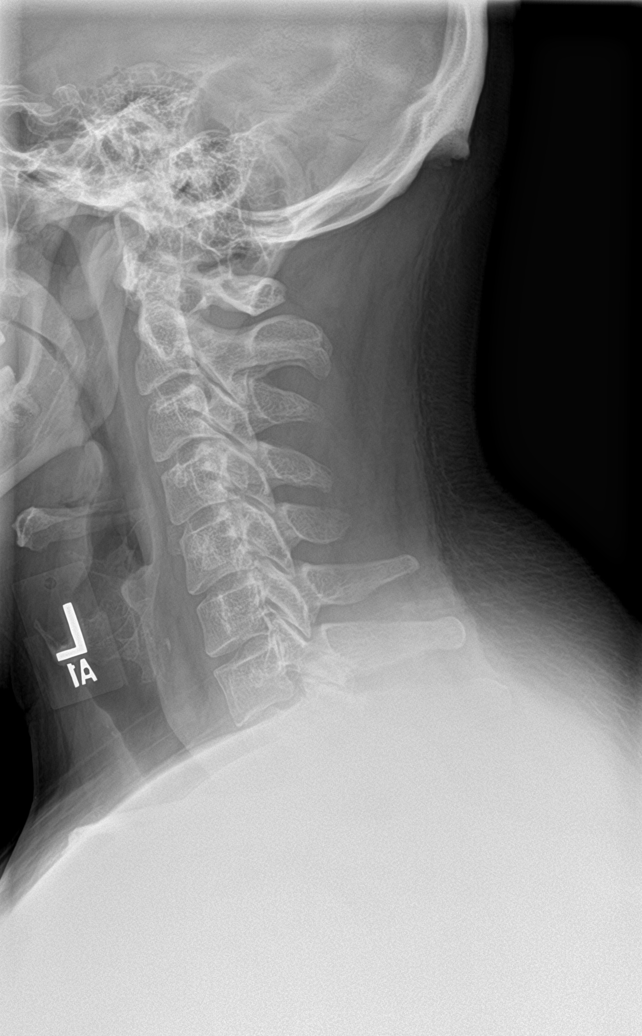

[c-spine obl]
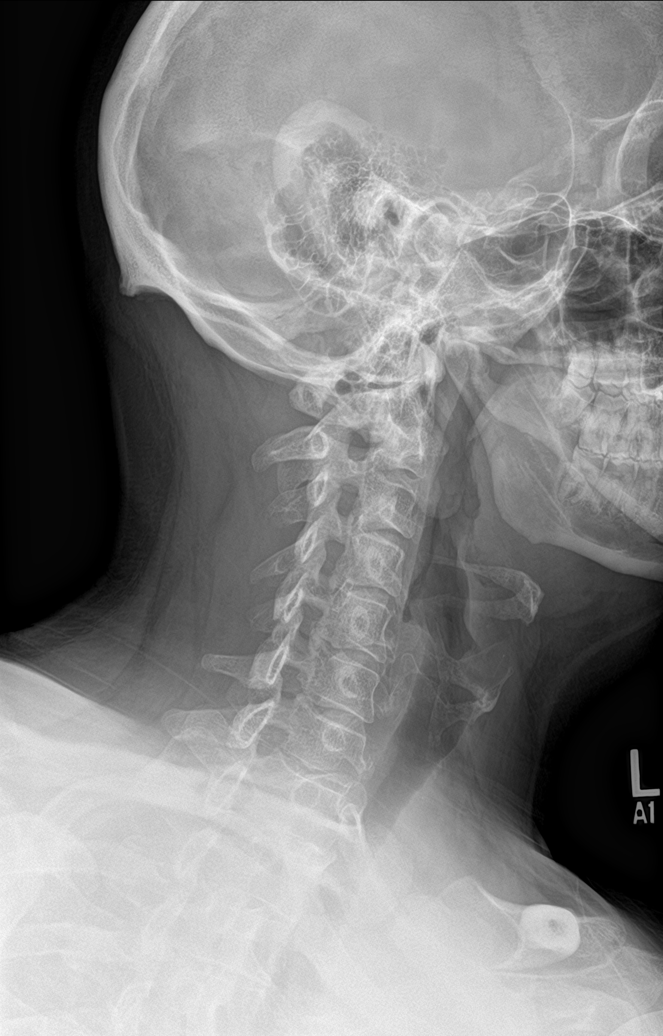

[c-spine ap (1 of 2)]
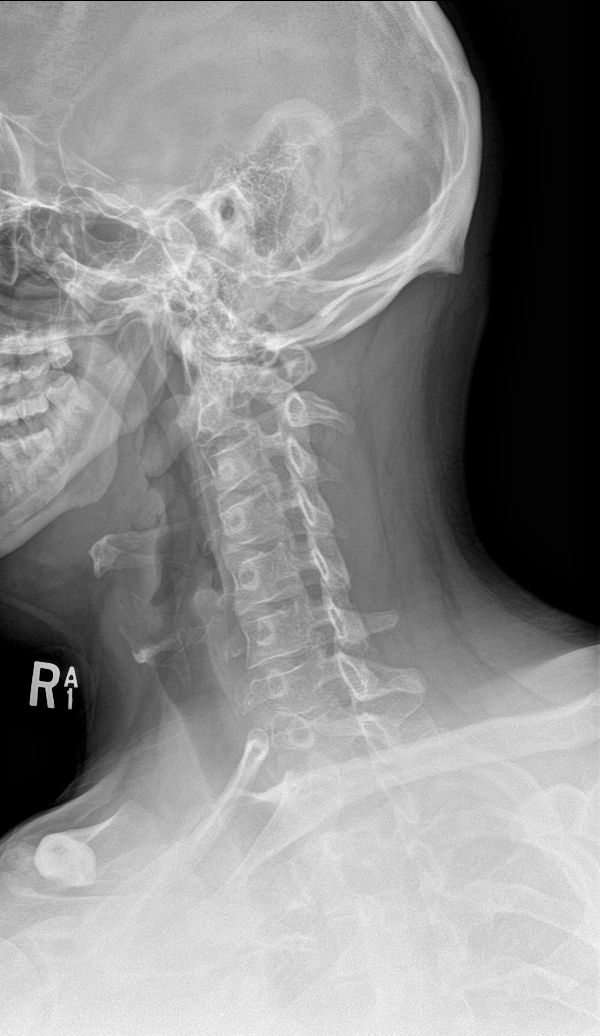

[c-spine open mouth]
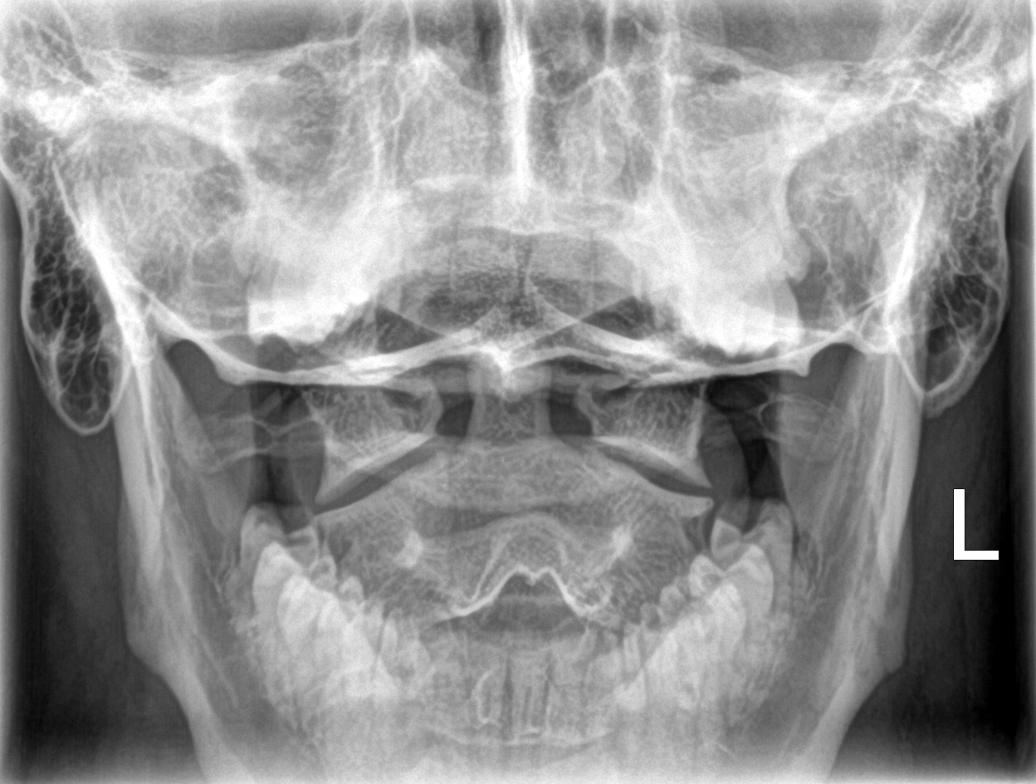

[swimmer]
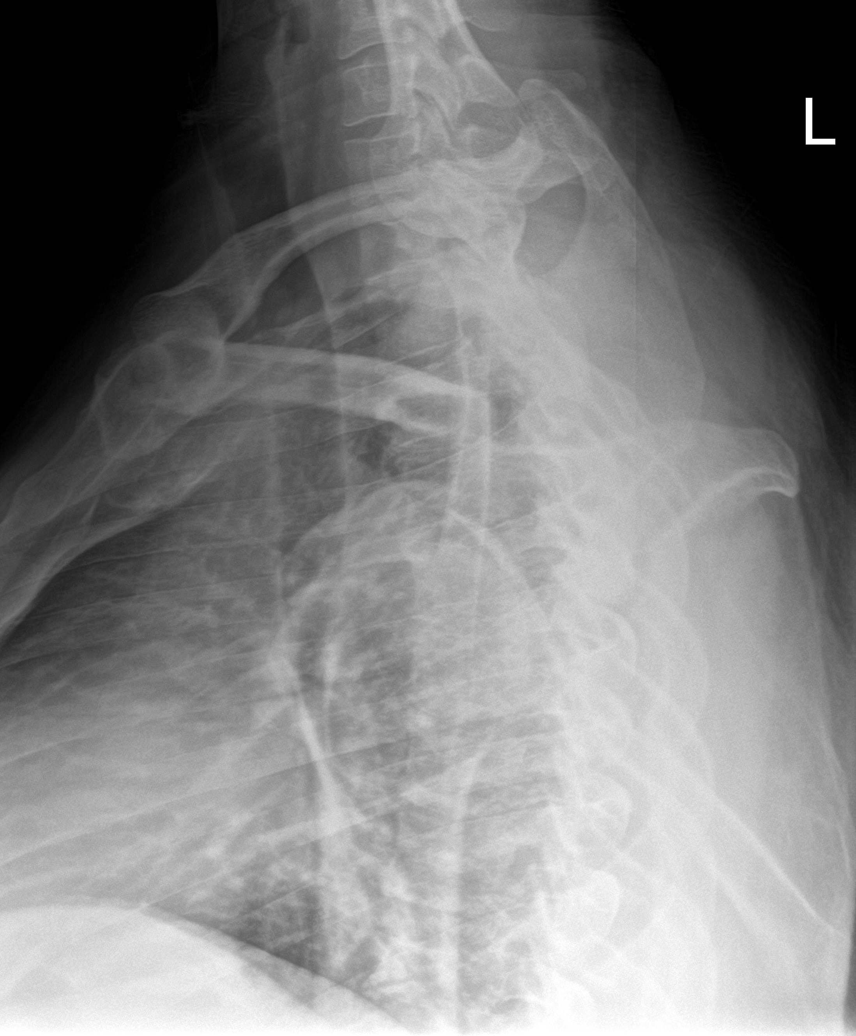

[c-spine ap (2 of 2)]
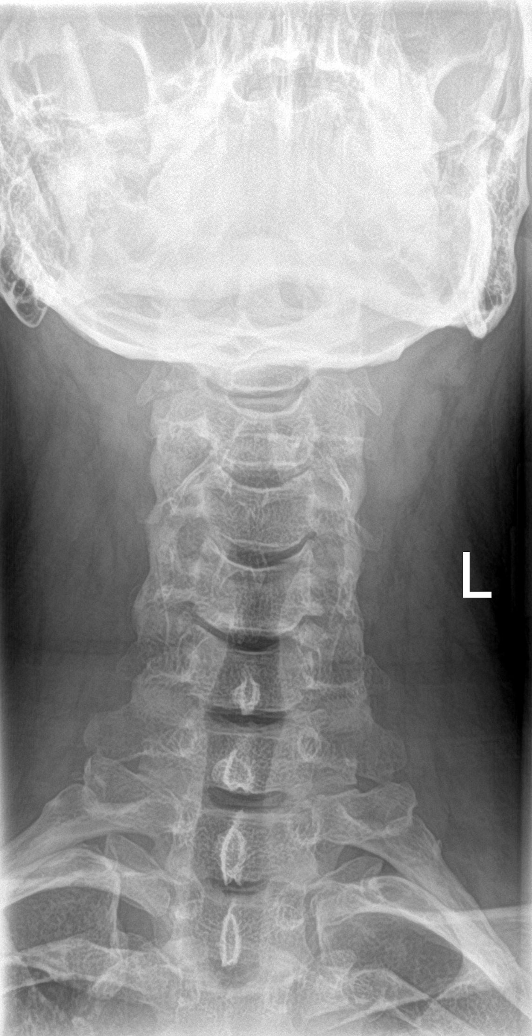

[6 of 6 positions shown; findings below may reference images not displayed]

FINDINGS: No acute soft tissue bony abnormality identified. No evidence of
fracture or dislocation. Normal alignment. Disc spaces
well-preserved.
IMPRESSION: No acute or focal abnormality.

## 2019-12-27 ENCOUNTER — Ambulatory Visit
Admission: EM | Admit: 2019-12-27 | Discharge: 2019-12-27 | Disposition: A | Discharge status: Home / Self Care | Payer: Managed Care, Other (non HMO) | Attending: Emergency Medicine | Admitting: Emergency Medicine

## 2019-12-27 DIAGNOSIS — U071 COVID-19: Secondary | ICD-10-CM

## 2019-12-27 DIAGNOSIS — Z1152 Encounter for screening for COVID-19: Secondary | ICD-10-CM

## 2019-12-27 HISTORY — DX: COVID-19: U07.1

## 2019-12-30 LAB — NOVEL CORONAVIRUS, NAA: SARS-CoV-2, NAA: DETECTED — AB

## 2020-02-19 ENCOUNTER — Encounter: Payer: Self-pay | Admitting: Family Medicine

## 2020-02-19 ENCOUNTER — Ambulatory Visit (INDEPENDENT_AMBULATORY_CARE_PROVIDER_SITE_OTHER): Payer: Managed Care, Other (non HMO) | Admitting: Family Medicine

## 2020-02-19 ENCOUNTER — Other Ambulatory Visit: Payer: Self-pay

## 2020-02-19 VITALS — BP 150/90 | HR 88 | Temp 98.9°F | Wt 274.6 lb

## 2020-02-19 DIAGNOSIS — N529 Male erectile dysfunction, unspecified: Secondary | ICD-10-CM | POA: Diagnosis not present

## 2020-02-19 DIAGNOSIS — M5442 Lumbago with sciatica, left side: Secondary | ICD-10-CM | POA: Diagnosis not present

## 2020-02-19 DIAGNOSIS — G8929 Other chronic pain: Secondary | ICD-10-CM | POA: Diagnosis not present

## 2020-02-19 MED ORDER — CYCLOBENZAPRINE HCL 10 MG PO TABS: 10.0000 mg | ORAL_TABLET | Freq: Three times a day (TID) | ORAL | 5 refills | Status: AC | PRN

## 2020-02-19 MED ORDER — OXYCODONE-ACETAMINOPHEN 10-325 MG PO TABS: 1.0000 | ORAL_TABLET | ORAL | 0 refills | Status: AC | PRN

## 2020-02-19 MED ORDER — SILDENAFIL CITRATE 100 MG PO TABS: 100.0000 mg | ORAL_TABLET | ORAL | 11 refills | Status: DC | PRN

## 2020-02-19 MED ORDER — METHYLPREDNISOLONE 4 MG PO TBPK: ORAL_TABLET | ORAL | 0 refills | Status: DC

## 2020-02-19 NOTE — Progress Notes (Signed)
° °  Subjective:    Patient ID: Maurice Morales, male    DOB: 1980-10-14, 39 y.o.   MRN: [id number]  HPI Here for several issues. First he has chronic low back pain that waxes and wanes, and last week it flared up again after he lifted something at work. He has sharp pain in the lower back with spasms. He is using heat and Ibuprofen. Also he had seen Dr. Marikay Alar for chronic low back pain and he had seen Dr. Joaquim Nam for pain management. He has received several epidural steroid injections with mixed responses. The last time he saw Dr. Murray Hodgkins was in June of 2020, and this was the last time he picked up any pain medication prescribed by Dr. Murray Hodgkins. He now asks for a referral to [facility] to get another opinion. In addition in the past 6 months he has noticed a big drop in his libido, and he cannot maintain erections very long. He has a physical coming up with [place] in a few weeks.    Review of Systems  Constitutional: Negative.   Respiratory: Negative.   Cardiovascular: Negative.   Musculoskeletal: Positive for back pain.       Objective:   Physical Exam Constitutional:      General: He is not in acute distress.    Appearance: Normal appearance.  Cardiovascular:     Rate and Rhythm: Normal rate and regular rhythm.     Pulses: Normal pulses.     Heart sounds: Normal heart sounds.  Pulmonary:     Effort: Pulmonary effort is normal.     Breath sounds: Normal breath sounds.  Musculoskeletal:     Comments: Tender in the lower back with spasm, full ROM. Negative SLR   Neurological:     Mental Status: He is alert.           Assessment & Plan:  Acute exacerbation of chronic low back pain. We will treat with a Medrol dose pack, Flexeril, and a limited supply of Percocet. We will refer him to [facility]. For the ED he will try Viagra 100 mg as needed. We will check a testosterone level when he comes back for his physical.  Gershon Crane, MD

## 2020-02-27 ENCOUNTER — Telehealth: Payer: Self-pay | Admitting: Family Medicine

## 2020-02-27 NOTE — Telephone Encounter (Signed)
Patient is calling and requesting a refill for oxycodone 10 mg sent to [id number] - [place], [place] - [address]  [address], [place] [address]  Phone:  [phone] Fax:  [phone] CB is [phone]

## 2020-02-27 NOTE — Telephone Encounter (Signed)
Received refill request for:  Oxycodone 10 mg LR 02/19/20, #30, 0 rf's LOV  02/29/20 FOV  03/05/20  Please review and advise.   Thanks.  Dm/cma \

## 2020-02-29 NOTE — Telephone Encounter (Signed)
Patient informed oxycodone refill not appropriate.

## 2020-02-29 NOTE — Telephone Encounter (Signed)
No I cannot prescribe any more narcotic medications outside of a pain management program. Hopefully the new Orthopedic doctor can help

## 2020-03-05 ENCOUNTER — Ambulatory Visit (INDEPENDENT_AMBULATORY_CARE_PROVIDER_SITE_OTHER): Payer: Managed Care, Other (non HMO) | Admitting: Family Medicine

## 2020-03-05 ENCOUNTER — Encounter: Payer: Self-pay | Admitting: Family Medicine

## 2020-03-05 ENCOUNTER — Other Ambulatory Visit: Payer: Self-pay

## 2020-03-05 VITALS — BP 136/80 | HR 81 | Temp 98.9°F | Ht 74.75 in | Wt 271.8 lb

## 2020-03-05 DIAGNOSIS — Z0000 Encounter for general adult medical examination without abnormal findings: Secondary | ICD-10-CM

## 2020-03-05 NOTE — Progress Notes (Signed)
° °  Subjective:    Patient ID: Maurice Morales, male    DOB: [birth year], 39 y.o.   MRN: [id number]  HPI Here for a well exam. He feels fine.    Review of Systems  Constitutional: Negative.   HENT: Negative.   Eyes: Negative.   Respiratory: Negative.   Cardiovascular: Negative.   Gastrointestinal: Negative.   Genitourinary: Negative.   Musculoskeletal: Negative.   Skin: Negative.   Neurological: Negative.   Psychiatric/Behavioral: Negative.        Objective:   Physical Exam Constitutional:      General: He is not in acute distress.    Appearance: Normal appearance. He is well-developed. He is not diaphoretic.  HENT:     Head: Normocephalic and atraumatic.     Right Ear: External ear normal.     Left Ear: External ear normal.     Nose: Nose normal.     Mouth/Throat:     Pharynx: No oropharyngeal exudate.  Eyes:     General: No scleral icterus.       Right eye: No discharge.        Left eye: No discharge.     Conjunctiva/sclera: Conjunctivae normal.     Pupils: Pupils are equal, round, and reactive to light.  Neck:     Thyroid: No thyromegaly.     Vascular: No JVD.     Trachea: No tracheal deviation.  Cardiovascular:     Rate and Rhythm: Normal rate and regular rhythm.     Heart sounds: Normal heart sounds. No murmur heard.  No friction rub. No gallop.   Pulmonary:     Effort: Pulmonary effort is normal. No respiratory distress.     Breath sounds: Normal breath sounds. No wheezing or rales.  Chest:     Chest wall: No tenderness.  Abdominal:     General: Bowel sounds are normal. There is no distension.     Palpations: Abdomen is soft. There is no mass.     Tenderness: There is no abdominal tenderness. There is no guarding or rebound.  Genitourinary:    Penis: Normal. No tenderness.      Testes: Normal.  Musculoskeletal:        General: No tenderness. Normal range of motion.     Cervical back: Neck supple.  Lymphadenopathy:     Cervical: No cervical  adenopathy.  Skin:    General: Skin is warm and dry.     Coloration: Skin is not pale.     Findings: No erythema or rash.  Neurological:     Mental Status: He is alert and oriented to person, place, and time.     Cranial Nerves: No cranial nerve deficit.     Motor: No abnormal muscle tone.     Coordination: Coordination normal.     Deep Tendon Reflexes: Reflexes are normal and symmetric. Reflexes normal.  Psychiatric:        Behavior: Behavior normal.        Thought Content: Thought content normal.        Judgment: Judgment normal.           Assessment & Plan:  Well exam. We discussed diet and exercise. Get fasting labs soon.  Gershon Crane, MD

## 2020-03-06 ENCOUNTER — Other Ambulatory Visit: Payer: Managed Care, Other (non HMO)

## 2020-03-06 ENCOUNTER — Other Ambulatory Visit: Payer: Self-pay

## 2020-03-06 DIAGNOSIS — Z0000 Encounter for general adult medical examination without abnormal findings: Secondary | ICD-10-CM

## 2020-03-07 LAB — HEPATIC FUNCTION PANEL
AG Ratio: 1.6 (calc) (ref 1.0–2.5)
ALT: 42 U/L (ref 9–46)
AST: 22 U/L (ref 10–40)
Albumin: 4.4 g/dL (ref 3.6–5.1)
Alkaline phosphatase (APISO): 87 U/L (ref 36–130)
Bilirubin, Direct: 0.1 mg/dL (ref 0.0–0.2)
Globulin: 2.7 g/dL (calc) (ref 1.9–3.7)
Indirect Bilirubin: 0.2 mg/dL (calc) (ref 0.2–1.2)
Total Bilirubin: 0.3 mg/dL (ref 0.2–1.2)
Total Protein: 7.1 g/dL (ref 6.1–8.1)

## 2020-03-07 LAB — CBC WITH DIFFERENTIAL/PLATELET
Absolute Monocytes: 681 cells/uL (ref 200–950)
Basophils Absolute: 42 cells/uL (ref 0–200)
Basophils Relative: 0.5 %
Eosinophils Absolute: 166 cells/uL (ref 15–500)
Eosinophils Relative: 2 %
HCT: 50.5 % — ABNORMAL HIGH (ref 38.5–50.0)
Hemoglobin: 16.9 g/dL (ref 13.2–17.1)
Lymphs Abs: 1527 cells/uL (ref 850–3900)
MCH: 30.2 pg (ref 27.0–33.0)
MCHC: 33.5 g/dL (ref 32.0–36.0)
MCV: 90.2 fL (ref 80.0–100.0)
MPV: 11.1 fL (ref 7.5–12.5)
Monocytes Relative: 8.2 %
Neutro Abs: 5885 cells/uL (ref 1500–7800)
Neutrophils Relative %: 70.9 %
Platelets: 319 10*3/uL (ref 140–400)
RBC: 5.6 10*6/uL (ref 4.20–5.80)
RDW: 12 % (ref 11.0–15.0)
Total Lymphocyte: 18.4 %
WBC: 8.3 10*3/uL (ref 3.8–10.8)

## 2020-03-07 LAB — LIPID PANEL
Cholesterol: 222 mg/dL — ABNORMAL HIGH (ref <200)
HDL: 42 mg/dL (ref >=40)
LDL Cholesterol (Calc): 155 mg/dL (calc) — ABNORMAL HIGH
Non-HDL Cholesterol (Calc): 180 mg/dL (calc) — ABNORMAL HIGH (ref <130)
Total CHOL/HDL Ratio: 5.3 (calc) — ABNORMAL HIGH (ref <5.0)
Triglycerides: 125 mg/dL (ref <150)

## 2020-03-07 LAB — BASIC METABOLIC PANEL WITH GFR
BUN: 16 mg/dL (ref 7–25)
CO2: 27 mmol/L (ref 20–32)
Calcium: 10.2 mg/dL (ref 8.6–10.3)
Chloride: 101 mmol/L (ref 98–110)
Creat: 1.14 mg/dL (ref 0.60–1.35)
GFR, Est African American: 92 mL/min/{1.73_m2} (ref >=60)
GFR, Est Non African American: 79 mL/min/{1.73_m2} (ref >=60)
Glucose, Bld: 122 mg/dL — ABNORMAL HIGH (ref 65–99)
Potassium: 5.1 mmol/L (ref 3.5–5.3)
Sodium: 139 mmol/L (ref 135–146)

## 2020-03-07 LAB — TSH: TSH: 0.98 mIU/L (ref 0.40–4.50)

## 2020-03-07 LAB — HEMOGLOBIN A1C
Hgb A1c MFr Bld: 6.1 % of total Hgb — ABNORMAL HIGH (ref <5.7)
Mean Plasma Glucose: 128 (calc)
eAG (mmol/L): 7.1 (calc)

## 2020-03-07 LAB — TESTOSTERONE: Testosterone: 621 ng/dL (ref 250–827)

## 2020-04-03 ENCOUNTER — Other Ambulatory Visit: Payer: Self-pay | Admitting: Orthopedic Surgery

## 2020-04-03 DIAGNOSIS — M5412 Radiculopathy, cervical region: Secondary | ICD-10-CM

## 2020-04-18 ENCOUNTER — Ambulatory Visit
Admission: RE | Admit: 2020-04-18 | Discharge: 2020-04-18 | Disposition: A | Discharge status: Home / Self Care | Payer: Managed Care, Other (non HMO) | Source: Ambulatory Visit | Attending: Orthopedic Surgery | Admitting: Orthopedic Surgery

## 2020-04-18 ENCOUNTER — Other Ambulatory Visit: Payer: Self-pay

## 2020-04-18 DIAGNOSIS — M5412 Radiculopathy, cervical region: Secondary | ICD-10-CM

## 2020-04-18 MED ORDER — TRIAMCINOLONE ACETONIDE 40 MG/ML IJ SUSP (RADIOLOGY)
60.0000 mg | Freq: Once | INTRAMUSCULAR | Status: AC
  Administered 2020-04-18: 60 mg via EPIDURAL

## 2020-04-18 MED ORDER — IOPAMIDOL (ISOVUE-M 200) INJECTION 41%
1.0000 mL | Freq: Once | INTRAMUSCULAR | Status: AC
  Administered 2020-04-18: 1 mL via EPIDURAL

## 2020-04-18 NOTE — Discharge Instructions (Signed)

## 2020-05-01 ENCOUNTER — Telehealth: Payer: Self-pay | Admitting: Family Medicine

## 2020-05-01 NOTE — Telephone Encounter (Signed)
No, this has been treated by [employer]

## 2020-05-01 NOTE — Telephone Encounter (Signed)
Patient is calling and wanted to see if provider would refill his Tramodol and send it to  [facility] [address], [place]  [address], [place] [address]  Phone:  [phone] Fax:  [phone]  CB is [phone]

## 2020-05-01 NOTE — Telephone Encounter (Signed)
Called patient - Maurice Morales.

## 2021-03-13 HISTORY — PX: LEG TENDON SURGERY: SHX1004

## 2021-07-31 ENCOUNTER — Other Ambulatory Visit: Payer: Self-pay

## 2021-07-31 ENCOUNTER — Emergency Department (HOSPITAL_COMMUNITY): Payer: Managed Care, Other (non HMO)

## 2021-07-31 ENCOUNTER — Encounter (HOSPITAL_COMMUNITY): Payer: Self-pay | Admitting: *Deleted

## 2021-07-31 ENCOUNTER — Inpatient Hospital Stay: Admit: 2021-07-31 | Payer: Managed Care, Other (non HMO) | Admitting: Urology

## 2021-07-31 ENCOUNTER — Encounter (HOSPITAL_COMMUNITY)
Admission: EM | Disposition: A | Discharge status: Home / Self Care | Payer: Self-pay | Source: Home / Self Care | Attending: Internal Medicine

## 2021-07-31 ENCOUNTER — Inpatient Hospital Stay (HOSPITAL_COMMUNITY)
Admission: EM | Admit: 2021-07-31 | Discharge: 2021-08-01 | DRG: 854 | Disposition: A | Discharge status: Home / Self Care | Payer: Managed Care, Other (non HMO) | Attending: Internal Medicine | Admitting: Internal Medicine

## 2021-07-31 ENCOUNTER — Inpatient Hospital Stay (HOSPITAL_COMMUNITY): Payer: Managed Care, Other (non HMO) | Admitting: Certified Registered Nurse Anesthetist

## 2021-07-31 ENCOUNTER — Inpatient Hospital Stay (HOSPITAL_COMMUNITY): Payer: Managed Care, Other (non HMO)

## 2021-07-31 DIAGNOSIS — N200 Calculus of kidney: Secondary | ICD-10-CM

## 2021-07-31 DIAGNOSIS — Z6831 Body mass index (BMI) 31.0-31.9, adult: Secondary | ICD-10-CM

## 2021-07-31 DIAGNOSIS — Z825 Family history of asthma and other chronic lower respiratory diseases: Secondary | ICD-10-CM

## 2021-07-31 DIAGNOSIS — Z79899 Other long term (current) drug therapy: Secondary | ICD-10-CM | POA: Diagnosis not present

## 2021-07-31 DIAGNOSIS — N179 Acute kidney failure, unspecified: Secondary | ICD-10-CM | POA: Diagnosis present

## 2021-07-31 DIAGNOSIS — F17210 Nicotine dependence, cigarettes, uncomplicated: Secondary | ICD-10-CM | POA: Diagnosis present

## 2021-07-31 DIAGNOSIS — A419 Sepsis, unspecified organism: Principal | ICD-10-CM | POA: Diagnosis present

## 2021-07-31 DIAGNOSIS — Z20822 Contact with and (suspected) exposure to covid-19: Secondary | ICD-10-CM | POA: Diagnosis present

## 2021-07-31 DIAGNOSIS — Z720 Tobacco use: Secondary | ICD-10-CM

## 2021-07-31 DIAGNOSIS — N390 Urinary tract infection, site not specified: Secondary | ICD-10-CM | POA: Diagnosis not present

## 2021-07-31 DIAGNOSIS — R1032 Left lower quadrant pain: Secondary | ICD-10-CM | POA: Diagnosis present

## 2021-07-31 DIAGNOSIS — Z833 Family history of diabetes mellitus: Secondary | ICD-10-CM | POA: Diagnosis not present

## 2021-07-31 DIAGNOSIS — E669 Obesity, unspecified: Secondary | ICD-10-CM | POA: Diagnosis present

## 2021-07-31 DIAGNOSIS — N136 Pyonephrosis: Secondary | ICD-10-CM | POA: Diagnosis present

## 2021-07-31 HISTORY — PX: CYSTOSCOPY W/ URETERAL STENT PLACEMENT: SHX1429

## 2021-07-31 LAB — COMPREHENSIVE METABOLIC PANEL
ALT: 38 U/L (ref 0–44)
AST: 26 U/L (ref 15–41)
Albumin: 4.2 g/dL (ref 3.5–5.0)
Alkaline Phosphatase: 102 U/L (ref 38–126)
Anion gap: 10 (ref 5–15)
BUN: 21 mg/dL — ABNORMAL HIGH (ref 6–20)
CO2: 24 mmol/L (ref 22–32)
Calcium: 9.3 mg/dL (ref 8.9–10.3)
Chloride: 102 mmol/L (ref 98–111)
Creatinine, Ser: 1.61 mg/dL — ABNORMAL HIGH (ref 0.61–1.24)
GFR, Estimated: 54 mL/min — ABNORMAL LOW (ref >60)
Glucose, Bld: 115 mg/dL — ABNORMAL HIGH (ref 70–99)
Potassium: 3.9 mmol/L (ref 3.5–5.1)
Sodium: 136 mmol/L (ref 135–145)
Total Bilirubin: 0.7 mg/dL (ref 0.3–1.2)
Total Protein: 7.9 g/dL (ref 6.5–8.1)

## 2021-07-31 LAB — RESP PANEL BY RT-PCR (FLU A&B, COVID) ARPGX2
Influenza A by PCR: NEGATIVE
Influenza B by PCR: NEGATIVE
SARS Coronavirus 2 by RT PCR: NEGATIVE

## 2021-07-31 LAB — CBC WITH DIFFERENTIAL/PLATELET
Abs Immature Granulocytes: 0.07 10*3/uL (ref 0.00–0.07)
Basophils Absolute: 0.1 10*3/uL (ref 0.0–0.1)
Basophils Relative: 1 %
Eosinophils Absolute: 0.1 10*3/uL (ref 0.0–0.5)
Eosinophils Relative: 1 %
HCT: 48.2 % (ref 39.0–52.0)
Hemoglobin: 15.6 g/dL (ref 13.0–17.0)
Immature Granulocytes: 1 %
Lymphocytes Relative: 8 %
Lymphs Abs: 1.2 10*3/uL (ref 0.7–4.0)
MCH: 29.4 pg (ref 26.0–34.0)
MCHC: 32.4 g/dL (ref 30.0–36.0)
MCV: 90.8 fL (ref 80.0–100.0)
Monocytes Absolute: 2 10*3/uL — ABNORMAL HIGH (ref 0.1–1.0)
Monocytes Relative: 14 %
Neutro Abs: 11.1 10*3/uL — ABNORMAL HIGH (ref 1.7–7.7)
Neutrophils Relative %: 75 %
Platelets: 375 10*3/uL (ref 150–400)
RBC: 5.31 MIL/uL (ref 4.22–5.81)
RDW: 12.9 % (ref 11.5–15.5)
WBC: 14.4 10*3/uL — ABNORMAL HIGH (ref 4.0–10.5)
nRBC: 0 % (ref 0.0–0.2)

## 2021-07-31 LAB — URINALYSIS, ROUTINE W REFLEX MICROSCOPIC
Bacteria, UA: NONE SEEN
Bilirubin Urine: NEGATIVE
Glucose, UA: 50 mg/dL — AB
Ketones, ur: NEGATIVE mg/dL
Leukocytes,Ua: NEGATIVE
Nitrite: NEGATIVE
Protein, ur: 30 mg/dL — AB
Specific Gravity, Urine: 1.028 (ref 1.005–1.030)
pH: 6 (ref 5.0–8.0)

## 2021-07-31 LAB — LACTIC ACID, PLASMA
Lactic Acid, Venous: 1.2 mmol/L (ref 0.5–1.9)
Lactic Acid, Venous: 1 mmol/L (ref 0.5–1.9)

## 2021-07-31 SURGERY — CYSTOSCOPY, WITH RETROGRADE PYELOGRAM AND URETERAL STENT INSERTION
Anesthesia: General | Site: Urethra | Laterality: Left

## 2021-07-31 MED ORDER — MIDAZOLAM HCL 2 MG/2ML IJ SOLN
INTRAMUSCULAR | Status: AC
  Filled 2021-07-31: qty 2

## 2021-07-31 MED ORDER — SODIUM CHLORIDE 0.9 % IV SOLN: 1.0000 g | INTRAVENOUS | Status: DC

## 2021-07-31 MED ORDER — ACETAMINOPHEN 325 MG PO TABS: 650.0000 mg | ORAL_TABLET | Freq: Four times a day (QID) | ORAL | Status: DC | PRN

## 2021-07-31 MED ORDER — PROPOFOL 500 MG/50ML IV EMUL
INTRAVENOUS | Status: DC | PRN
  Administered 2021-07-31: 200 mg via INTRAVENOUS

## 2021-07-31 MED ORDER — SODIUM CHLORIDE 0.9 % IV SOLN: INTRAVENOUS | Status: AC

## 2021-07-31 MED ORDER — SODIUM CHLORIDE 0.9 % IV SOLN
1.0000 g | Freq: Once | INTRAVENOUS | Status: AC
  Administered 2021-07-31: 1 g via INTRAVENOUS
  Filled 2021-07-31: qty 10

## 2021-07-31 MED ORDER — FENTANYL CITRATE PF 50 MCG/ML IJ SOSY
25.0000 ug | PREFILLED_SYRINGE | Freq: Once | INTRAMUSCULAR | Status: AC
  Administered 2021-07-31: 25 ug via INTRAVENOUS
  Filled 2021-07-31: qty 1

## 2021-07-31 MED ORDER — ACETAMINOPHEN 325 MG PO TABS
650.0000 mg | ORAL_TABLET | Freq: Once | ORAL | Status: AC
  Administered 2021-07-31: 650 mg via ORAL
  Filled 2021-07-31: qty 2

## 2021-07-31 MED ORDER — LIDOCAINE 2% (20 MG/ML) 5 ML SYRINGE
INTRAMUSCULAR | Status: DC | PRN
  Administered 2021-07-31: 50 mg via INTRAVENOUS

## 2021-07-31 MED ORDER — MIDAZOLAM HCL 2 MG/2ML IJ SOLN
INTRAMUSCULAR | Status: DC | PRN
  Administered 2021-07-31: 2 mg via INTRAVENOUS

## 2021-07-31 MED ORDER — FENTANYL CITRATE (PF) 100 MCG/2ML IJ SOLN
INTRAMUSCULAR | Status: AC
  Filled 2021-07-31: qty 2

## 2021-07-31 MED ORDER — ONDANSETRON HCL 4 MG/2ML IJ SOLN
INTRAMUSCULAR | Status: DC | PRN
  Administered 2021-07-31: 4 mg via INTRAVENOUS

## 2021-07-31 MED ORDER — HYDROMORPHONE HCL 1 MG/ML IJ SOLN
0.5000 mg | INTRAMUSCULAR | Status: DC | PRN
  Administered 2021-07-31 – 2021-08-01 (×2): 0.5 mg via INTRAVENOUS
  Filled 2021-07-31 (×2): qty 0.5

## 2021-07-31 MED ORDER — IOHEXOL 300 MG/ML  SOLN
INTRAMUSCULAR | Status: DC | PRN
  Administered 2021-07-31: 6 mL via URETHRAL

## 2021-07-31 MED ORDER — DEXAMETHASONE SODIUM PHOSPHATE 10 MG/ML IJ SOLN
INTRAMUSCULAR | Status: DC | PRN
  Administered 2021-07-31: 10 mg via INTRAVENOUS

## 2021-07-31 MED ORDER — FENTANYL CITRATE (PF) 100 MCG/2ML IJ SOLN
INTRAMUSCULAR | Status: DC | PRN
  Administered 2021-07-31 (×2): 50 ug via INTRAVENOUS

## 2021-07-31 MED ORDER — PROPOFOL 10 MG/ML IV BOLUS
INTRAVENOUS | Status: AC
  Filled 2021-07-31: qty 20

## 2021-07-31 MED ORDER — SODIUM CHLORIDE 0.9 % IV BOLUS
1000.0000 mL | Freq: Once | INTRAVENOUS | Status: AC
  Administered 2021-07-31: 1000 mL via INTRAVENOUS

## 2021-07-31 MED ORDER — LACTATED RINGERS IV SOLN: INTRAVENOUS | Status: DC | PRN

## 2021-07-31 MED ORDER — STERILE WATER FOR IRRIGATION IR SOLN
Status: DC | PRN
  Administered 2021-07-31: 3000 mL

## 2021-07-31 SURGICAL SUPPLY — 13 items
BAG URO CATCHER STRL LF (MISCELLANEOUS) ×2 IMPLANT
BULB IRRIG PATHFIND (MISCELLANEOUS) IMPLANT
CATH URETL OPEN END 6FR 70 (CATHETERS) ×2 IMPLANT
CLOTH BEACON ORANGE TIMEOUT ST (SAFETY) ×2 IMPLANT
GLOVE SURG LX 7.5 STRW (GLOVE) ×1
GLOVE SURG LX STRL 7.5 STRW (GLOVE) ×1 IMPLANT
GUIDEWIRE STR DUAL SENSOR (WIRE) ×2 IMPLANT
MANIFOLD NEPTUNE II (INSTRUMENTS) ×2 IMPLANT
PACK CYSTO (CUSTOM PROCEDURE TRAY) ×2 IMPLANT
STENT URET 6FRX28 CONTOUR (STENTS) ×1 IMPLANT
SYR 20ML LL LF (SYRINGE) ×2 IMPLANT
TUBING CONNECTING 10 (TUBING) ×2 IMPLANT
TUBING UROLOGY SET (TUBING) IMPLANT

## 2021-07-31 NOTE — H&P (Signed)
?History and Physical  ? ? ?Patient: Maurice Morales [id number] DOB: [birth year] ?DOA: 07/31/2021 ?DOS: the patient was seen and examined on 07/31/2021 ?PCP: Laurey Morale, MD  ?Patient coming from: Home ? ?Chief Complaint:  ?Chief Complaint  ?Patient presents with  ? Flank Pain  ? ?HPI: Maurice Morales is a 41 y.o. male with medical history significant of tobacco use who presents to the emergency department due to 3-day onset of left flank pain.  Pain was stabbing, intermittent and nonradiating in nature.  Pain started while at work on Monday (3 days ago), but it self resolved after a few hours, the pain returned on Tuesday (2 days ago) and also lasted a few hours and self resolved.  Yesterday, there was a recurrence of the pain, but the pain became persistent and was rated at 7/10 on pain scale, this was associated with 3-4 episodes of nonbloody vomiting this morning.  He tried to get an appointment with his PCP, but he was asked to go to the ED for further evaluation and management.  He denies chest pain, shortness of breath, abdominal pain, diarrhea, constipation, blurry vision. ? ?ED Course:  ?In the emergency department, he was initially febrile and tachycardic, BP was 156/102 and O2 sat was 99-100% on room air.  Work-up in the ED showed normal CBC except for leukocytosis, BMP was normal except for BUN/creatinine 21/1.61 (baseline creatinine at 1.1).  Influenza A, B, SARS coronavirus 2 was negative. ? CT abdomen and pelvis without contrast showed left-sided hydroureteronephrosis to the level of 2 stacked stones in the distal ureter at the ureterovesicular junction measuring up to 3 mm. Periureteric and perinephric stranding which likely reflects sequela of stone passage however superimposed infection would appears similar. ?She was treated with IV ceftriaxone, fentanyl was given due to pain, Tylenol was given due to fever and IV hydration was provided. ?Urologist on-call at [facility] was consulted and  recommended admitting patient to the hospitalist service at [facility] with plan to follow-up with patient on arrival to to [facility].  [facility] was called to admit patient for further evaluation and management. ? ?Review of Systems: ?Review of systems as noted in the HPI. All other systems reviewed and are negative. ? ? ?History reviewed. No pertinent past medical history. ?Past Surgical History:  ?Procedure Laterality Date  ? LEG TENDON SURGERY Left   ? ? ?Social History:  reports that he has been smoking cigarettes. He has a 5.00 pack-year smoking history. He has never used smokeless tobacco. He reports current alcohol use. He reports that he does not use drugs. ? ? ?No Known Allergies ? ?Family History  ?Problem Relation Age of Onset  ? COPD Father   ? COPD Paternal Uncle   ? Diabetes Maternal Grandmother   ? COPD Paternal Grandfather   ?  ? ?Prior to Admission medications   ?Medication Sig Start Date End Date Taking? Authorizing Provider  ?Aspirin-Salicylamide-Caffeine (BC HEADACHE POWDER PO) Take 1-2 packets by mouth daily.   Yes [provider]  ?cyclobenzaprine (FLEXERIL) 10 MG tablet Take 1 tablet (10 mg total) by mouth 3 (three) times daily as needed for muscle spasms. 02/19/20  Yes Laurey Morale, MD  ?ibuprofen (ADVIL) 200 MG tablet Take 400 mg by mouth every 6 (six) hours as needed.   Yes [provider]  ?methylPREDNISolone (MEDROL DOSEPAK) 4 MG TBPK tablet As directed ?Patient not taking: Reported on 03/05/2020 02/19/20   Laurey Morale, MD  ?sildenafil (VIAGRA) 100  MG tablet Take 1 tablet (100 mg total) by mouth as needed for erectile dysfunction. ?Patient not taking: Reported on 07/31/2021 02/19/20   Laurey Morale, MD  ? ? ?Physical Exam: ?BP 140/82   Pulse 87   Temp 99.3 ?F (37.4 ?C) (Oral)   Resp 16   Ht 6\' 3"  (1.905 m)   Wt 112.5 kg   SpO2 100%   BMI 31.00 kg/m?  ? ?General: 41 y.o. year-old male well developed well nourished in no acute distress.  Alert and oriented  x3. ?HEENT: NCAT, EOMI ?Neck: Supple, trachea medial ?Cardiovascular: Regular rate and rhythm with no rubs or gallops.  No thyromegaly or JVD noted.  No lower extremity edema. 2/4 pulses in all 4 extremities. ?Respiratory: Clear to auscultation with no wheezes or rales. Good inspiratory effort. ?Abdomen: Soft, tender to palpation of left flank.  Nondistended with normal bowel sounds x4 quadrants. ?Muskuloskeletal: No cyanosis, clubbing or edema noted bilaterally ?Neuro: CN II-XII intact, strength 5/5 x 4, sensation, reflexes intact ?Skin: No ulcerative lesions noted or rashes ?Psychiatry: Judgement and insight appear normal. Mood is appropriate for condition and setting ?   ?   ?   ?Labs on Admission:  ?Basic Metabolic Panel: ?Recent Labs  ?Lab 07/31/21 ?1750  ?NA 136  ?K 3.9  ?CL 102  ?CO2 24  ?GLUCOSE 115*  ?BUN 21*  ?CREATININE 1.61*  ?CALCIUM 9.3  ? ?Liver Function Tests: ?Recent Labs  ?Lab 07/31/21 ?1750  ?AST 26  ?ALT 38  ?ALKPHOS 102  ?BILITOT 0.7  ?PROT 7.9  ?ALBUMIN 4.2  ? ?No results for input(s): LIPASE, AMYLASE in the last 168 hours. ?No results for input(s): AMMONIA in the last 168 hours. ?CBC: ?Recent Labs  ?Lab 07/31/21 ?1750  ?WBC 14.4*  ?NEUTROABS 11.1*  ?HGB 15.6  ?HCT 48.2  ?MCV 90.8  ?PLT 375  ? ?Cardiac Enzymes: ?No results for input(s): CKTOTAL, CKMB, CKMBINDEX, TROPONINI in the last 168 hours. ? ?BNP (last 3 results) ?No results for input(s): BNP in the last 8760 hours. ? ?ProBNP (last 3 results) ?No results for input(s): PROBNP in the last 8760 hours. ? ?CBG: ?No results for input(s): GLUCAP in the last 168 hours. ? ?Radiological Exams on Admission: ?CT Renal Stone Study ? ?Result Date: 07/31/2021 ?CLINICAL DATA:  Left flank pain x3 days.  Concern for renal stone. EXAM: CT ABDOMEN AND PELVIS WITHOUT CONTRAST TECHNIQUE: Multidetector CT imaging of the abdomen and pelvis was performed following the standard protocol without IV contrast. RADIATION DOSE REDUCTION: This exam was performed according  to the departmental dose-optimization program which includes automated exposure control, adjustment of the mA and/or kV according to patient size and/or use of iterative reconstruction technique. COMPARISON:  MRI lumbar spine March 23, 2020 FINDINGS: Lower chest: No acute abnormality. Hepatobiliary: No suspicious hepatic lesion. Gallbladder is unremarkable. No biliary ductal dilation. Pancreas: No pancreatic ductal dilation or evidence of acute inflammation. Spleen: No splenomegaly or focal splenic lesion. Adrenals/Urinary Tract: Bilateral adrenal glands appear normal. Left-sided hydroureteronephrosis to the level of 2 stacked stones in the distal ureter at the ureterovesicular junction measuring up to 3 mm. There is periureteric and perinephric stranding which likely reflects sequela of stone passage however superimposed infection would appears similar. Right kidney is unremarkable. Urinary bladder is unremarkable for degree of distension. Stomach/Bowel: No enteric contrast was administered. Stomach is unremarkable for degree of distension. No pathologic dilation of small or large bowel. The appendix and terminal ileum appear normal. No evidence of acute bowel inflammation. Vascular/Lymphatic:  Normal caliber abdominal aorta. No pathologically enlarged abdominal or pelvic lymph nodes. Reproductive: Prostate is unremarkable. Other: No pneumoperitoneum.  No walled off fluid collections. Musculoskeletal: Lumbar spondylosis.  No acute osseous abnormality. IMPRESSION: Left-sided hydroureteronephrosis to the level of 2 stacked stones in the distal ureter at the ureterovesicular junction measuring up to 3 mm. Periureteric and perinephric stranding which likely reflects sequela of stone passage however superimposed infection would appears similar, correlation with laboratory values suggested. Electronically Signed   By: Dahlia Bailiff M.D.   On: 07/31/2021 18:41   ? ?EKG: I independently viewed the EKG done and my  findings are as followed: EKG was not done in the ED ? ?Assessment/Plan ?Present on Admission: ? Sepsis secondary to UTI [facility]) ? ?Principal Problem: ?  Sepsis secondary to UTI [facility]) ?Active Problems: ?  AKI (acute kidney injur

## 2021-07-31 NOTE — ED Provider Notes (Signed)
?[facility] ?Provider Note ? ? ?CSN: [id number] ?Arrival date & time: 07/31/21  1728 ? ?  ? ?History ?Chief Complaint  ?Patient presents with  ? Flank Pain  ? ? ?Maurice Morales is a 41 y.o. male who presents to the emergency department today with a 1 week history of left lower back/flank pain.  This pain is nonradiating and intermittent.  He states it is sharp.  He was able to deal with the pain apart from today where it turned constant.  He reports associated urinary urgency but denies any urinary frequency.  He denies any diarrhea, cough, congestion, sore throat shortness of breath, or chest pain.  Also denies fever.  Patient did have 1 episode of vomiting this morning. ? ? ?Flank Pain ? ? ?  ? ?Home Medications ?Prior to Admission medications   ?Medication Sig Start Date End Date Taking? Authorizing Provider  ?cyclobenzaprine (FLEXERIL) 10 MG tablet Take 1 tablet (10 mg total) by mouth 3 (three) times daily as needed for muscle spasms. 02/19/20   Laurey Morale, MD  ?HYDROcodone-acetaminophen (NORCO/VICODIN) 5-325 MG tablet Take 1 tablet by mouth every 6 (six) hours as needed. 03/21/21   [provider]  ?methylPREDNISolone (MEDROL DOSEPAK) 4 MG TBPK tablet As directed ?Patient not taking: Reported on 03/05/2020 02/19/20   Laurey Morale, MD  ?sildenafil (VIAGRA) 100 MG tablet Take 1 tablet (100 mg total) by mouth as needed for erectile dysfunction. 02/19/20   Laurey Morale, MD  ?   ? ?Allergies    ?Patient has no known allergies.   ? ?Review of Systems   ?Review of Systems  ?Genitourinary:  Positive for flank pain.  ?All other systems reviewed and are negative. ? ?Physical Exam ?Updated Vital Signs ?BP 131/66 (BP Location: Left Arm)   Pulse 68   Temp 99.3 ?F (37.4 ?C) (Oral)   Resp 17   Ht 6\' 3"  (1.905 m)   Wt 112.5 kg   SpO2 100%   BMI 31.00 kg/m?  ?Physical Exam ?Vitals and nursing note reviewed.  ?Constitutional:   ?   General: He is not in acute distress. ?   Appearance:  Normal appearance.  ?HENT:  ?   Head: Normocephalic and atraumatic.  ?Eyes:  ?   General:     ?   Right eye: No discharge.     ?   Left eye: No discharge.  ?Cardiovascular:  ?   Comments: Regular rate and rhythm.  S1/S2 are distinct without any evidence of murmur, rubs, or gallops.  Radial pulses are 2+ bilaterally.  Dorsalis pedis pulses are 2+ bilaterally.  No evidence of pedal edema. ?Pulmonary:  ?   Comments: Clear to auscultation bilaterally.  Normal effort.  No respiratory distress.  No evidence of wheezes, rales, or rhonchi heard throughout. ?Abdominal:  ?   General: Abdomen is flat. Bowel sounds are normal. There is no distension.  ?   Tenderness: There is no abdominal tenderness. There is no right CVA tenderness, left CVA tenderness, guarding or rebound.  ?Musculoskeletal:     ?   General: Normal range of motion.  ?   Cervical back: Neck supple.  ?Skin: ?   General: Skin is warm and dry.  ?   Findings: No rash.  ?Neurological:  ?   General: No focal deficit present.  ?   Mental Status: He is alert.  ?Psychiatric:     ?   Mood and Affect: Mood normal.     ?  Behavior: Behavior normal.  ? ? ?ED Results / Procedures / Treatments   ?Labs ?(all labs ordered are listed, but only abnormal results are displayed) ?Labs Reviewed  ?COMPREHENSIVE METABOLIC PANEL - Abnormal; Notable for the following components:  ?    Result Value  ? Glucose, Bld 115 (*)   ? BUN 21 (*)   ? Creatinine, Ser 1.61 (*)   ? GFR, Estimated 54 (*)   ? All other components within normal limits  ?CBC WITH DIFFERENTIAL/PLATELET - Abnormal; Notable for the following components:  ? WBC 14.4 (*)   ? Neutro Abs 11.1 (*)   ? Monocytes Absolute 2.0 (*)   ? All other components within normal limits  ?URINALYSIS, ROUTINE W REFLEX MICROSCOPIC - Abnormal; Notable for the following components:  ? Glucose, UA 50 (*)   ? Hgb urine dipstick SMALL (*)   ? Protein, ur 30 (*)   ? All other components within normal limits  ?RESP PANEL BY RT-PCR (FLU A&B, COVID)  ARPGX2  ?LACTIC ACID, PLASMA  ?LACTIC ACID, PLASMA  ? ? ?EKG ?None ? ?Radiology ?CT Renal Stone Study ? ?Result Date: 07/31/2021 ?CLINICAL DATA:  Left flank pain x3 days.  Concern for renal stone. EXAM: CT ABDOMEN AND PELVIS WITHOUT CONTRAST TECHNIQUE: Multidetector CT imaging of the abdomen and pelvis was performed following the standard protocol without IV contrast. RADIATION DOSE REDUCTION: This exam was performed according to the departmental dose-optimization program which includes automated exposure control, adjustment of the mA and/or kV according to patient size and/or use of iterative reconstruction technique. COMPARISON:  MRI lumbar spine March 23, 2020 FINDINGS: Lower chest: No acute abnormality. Hepatobiliary: No suspicious hepatic lesion. Gallbladder is unremarkable. No biliary ductal dilation. Pancreas: No pancreatic ductal dilation or evidence of acute inflammation. Spleen: No splenomegaly or focal splenic lesion. Adrenals/Urinary Tract: Bilateral adrenal glands appear normal. Left-sided hydroureteronephrosis to the level of 2 stacked stones in the distal ureter at the ureterovesicular junction measuring up to 3 mm. There is periureteric and perinephric stranding which likely reflects sequela of stone passage however superimposed infection would appears similar. Right kidney is unremarkable. Urinary bladder is unremarkable for degree of distension. Stomach/Bowel: No enteric contrast was administered. Stomach is unremarkable for degree of distension. No pathologic dilation of small or large bowel. The appendix and terminal ileum appear normal. No evidence of acute bowel inflammation. Vascular/Lymphatic: Normal caliber abdominal aorta. No pathologically enlarged abdominal or pelvic lymph nodes. Reproductive: Prostate is unremarkable. Other: No pneumoperitoneum.  No walled off fluid collections. Musculoskeletal: Lumbar spondylosis.  No acute osseous abnormality. IMPRESSION: Left-sided  hydroureteronephrosis to the level of 2 stacked stones in the distal ureter at the ureterovesicular junction measuring up to 3 mm. Periureteric and perinephric stranding which likely reflects sequela of stone passage however superimposed infection would appears similar, correlation with laboratory values suggested. Electronically Signed   By: Dahlia Bailiff M.D.   On: 07/31/2021 18:41   ? ?Procedures ?Procedures  ? ? ?Medications Ordered in ED ?Medications  ?fentaNYL (SUBLIMAZE) injection 25 mcg (25 mcg Intravenous Given 07/31/21 1812)  ?sodium chloride 0.9 % bolus 1,000 mL (0 mLs Intravenous Stopped 07/31/21 2005)  ?acetaminophen (TYLENOL) tablet 650 mg (650 mg Oral Given 07/31/21 1810)  ?fentaNYL (SUBLIMAZE) injection 25 mcg (25 mcg Intravenous Given 07/31/21 1838)  ?cefTRIAXone (ROCEPHIN) 1 g in sodium chloride 0.9 % 100 mL IVPB (0 g Intravenous Stopped 07/31/21 2005)  ?fentaNYL (SUBLIMAZE) injection 25 mcg (25 mcg Intravenous Given 07/31/21 2003)  ? ? ?ED Course/ Medical Decision  Making/ A&P ?Clinical Course as of 07/31/21 2013  ?Thu Jul 31, 2021  ?1859 I spoke with Dr. Milford Cage with urology and he recommends stent placement.  He requests getting the patient to [facility] and keep him n.p.o. [CF]  ?2009 I spoke with Dr. Josephine Cables who agrees to admit the patient to the hospital.  [CF]  ?  ?Clinical Course User Index ?[CF] Myna Bright M, PA-C  ? ?                        ?Medical Decision Making ?Amount and/or Complexity of Data Reviewed ?Labs: ordered. ?Radiology: ordered. ? ?Risk ?OTC drugs. ?Prescription drug management. ?Decision regarding hospitalization. ? ? ?This patient presents to the ED for concern of left lower back pain and flank pain, this involves an extensive number of treatment options, and is a complaint that carries with it a high risk of complications and morbidity.  The differential diagnosis includes referred pain from cystitis, pyelonephritis, kidney stone, musculoskeletal spasm. ? ? ?Co  morbidities that complicate the patient evaluation ? ?None ? ? ?Additional history obtained: ? ?Additional history obtained from nursing note ? ? ?Lab Tests: ? ?I Ordered, and personally interpreted labs.  The pertinent results include: C

## 2021-07-31 NOTE — ED Notes (Signed)
Patient transported to CT 

## 2021-07-31 NOTE — Transfer of Care (Signed)
Immediate Anesthesia Transfer of Care Note ? ?Patient: Maurice Morales ? ?Procedure(s) Performed: Procedure(s): ?CYSTOSCOPY WITH RETROGRADE PYELOGRAM/URETERAL STENT PLACEMENT (Left) ? ?Patient Location: PACU ? ?Anesthesia Type:General ? ?Level of Consciousness: Alert, Awake, Oriented ? ?Airway & Oxygen Therapy: Patient Spontanous Breathing ? ?Post-op Assessment: Report given to RN ? ?Post vital signs: Reviewed and stable ? ?Last Vitals:  ?Vitals:  ? 07/31/21 2155 07/31/21 2155  ?BP: 138/87   ?Pulse: 83   ?Resp: 17   ?Temp:  36.9 ?C  ?SpO2: 94%   ? ? ?Complications: No apparent anesthesia complications ? ?

## 2021-07-31 NOTE — ED Notes (Signed)
Pt asked for more pain meds. Said the pain meds he got did not touch his pain. Provider made aware ?

## 2021-07-31 NOTE — Consult Note (Addendum)
Urology Consult Note  ? ?Requesting Attending Physician:  Bernadette Hoit, DO ?Service Providing Consult: Urology  ? ?Reason for Consult:  Nephrolithiasis ? ?HPI: Maurice Morales is seen in consultation for reasons noted above at the request of Bernadette Hoit, DO for evaluation of left ureteral stone and fevers. ? ?He notes that he has had symptoms for 1 day, including left flank pain, fevers, nausea/vomiting.  The patient denies any lower urinary tract symptoms, specifically dysuria, hematuria or urinary tract infection.  ? ?He has no previous history of kidney stones. ? ?In the ED, he is febrile to 101.4 and tachy to 102. Labs notable for Cr of 1.61 up from baseline of 1.1. Leukocytosis up to 14.4. UA is remarkably clean.  ? ?CT w/ 3 mm distal left ureteral stone. ? ?Past Medical History: ?History reviewed. No pertinent past medical history. ? ?Past Surgical History:  ?Past Surgical History:  ?Procedure Laterality Date  ? LEG TENDON SURGERY Left   ? ? ?Medication: ?Current Facility-Administered Medications  ?Medication Dose Route Frequency Provider Last Rate Last Admin  ? 0.9 %  sodium chloride infusion   Intravenous Continuous Adefeso, Oladapo, DO 125 mL/hr at 07/31/21 2156 New Bag at 07/31/21 2156  ? acetaminophen (TYLENOL) tablet 650 mg  650 mg Oral Q6H PRN Bernadette Hoit, DO      ? [START ON 08/01/2021] cefTRIAXone (ROCEPHIN) 1 g in sodium chloride 0.9 % 100 mL IVPB  1 g Intravenous Q24H Adefeso, Oladapo, DO      ? HYDROmorphone (DILAUDID) injection 0.5 mg  0.5 mg Intravenous Q4H PRN Adefeso, Oladapo, DO   0.5 mg at 07/31/21 2127  ? iohexol (OMNIPAQUE) 300 MG/ML solution    PRN Remi Haggard, MD   10 mL at 07/31/21 2147  ? ?Current Outpatient Medications  ?Medication Sig Dispense Refill  ? Aspirin-Salicylamide-Caffeine (BC HEADACHE POWDER PO) Take 1-2 packets by mouth daily.    ? cyclobenzaprine (FLEXERIL) 10 MG tablet Take 1 tablet (10 mg total) by mouth 3 (three) times daily as needed for muscle  spasms. 60 tablet 5  ? ibuprofen (ADVIL) 200 MG tablet Take 400 mg by mouth every 6 (six) hours as needed.    ? methylPREDNISolone (MEDROL DOSEPAK) 4 MG TBPK tablet As directed (Patient not taking: Reported on 03/05/2020) 21 tablet 0  ? sildenafil (VIAGRA) 100 MG tablet Take 1 tablet (100 mg total) by mouth as needed for erectile dysfunction. (Patient not taking: Reported on 07/31/2021) 10 tablet 11  ? ? ?Allergies: ?No Known Allergies ? ?Social History: ?Social History  ? ?Tobacco Use  ? Smoking status: Every Day  ?  Packs/day: 0.50  ?  Years: 10.00  ?  Pack years: 5.00  ?  Types: Cigarettes  ? Smokeless tobacco: Never  ?Vaping Use  ? Vaping Use: Never used  ?Substance Use Topics  ? Alcohol use: Yes  ?  Comment: occ  ? Drug use: No  ? ? ?Family History ?Family History  ?Problem Relation Age of Onset  ? COPD Father   ? COPD Paternal Uncle   ? Diabetes Maternal Grandmother   ? COPD Paternal Grandfather   ? ? ?Review of Systems ?10 systems were reviewed and are negative except as noted specifically in the HPI. ? ?Objective  ? ?Vital signs in last 24 hours: ?BP 138/87   Pulse 83   Temp 98.5 ?F (36.9 ?C) (Oral)   Resp 17   Ht 6\' 3"  (1.905 m)   Wt 112.5 kg   SpO2 94%  BMI 31.00 kg/m?  ? ?Physical Exam ?General: NAD, A&O, resting, appropriate ?HEENT: [place]/AT, EOMI, MMM ?Pulmonary: Normal work of breathing ?Cardiovascular: HDS, adequate peripheral perfusion ?Abdomen: Soft, NTTP, nondistended. ?GU: left CVA tenderness ?Extremities: warm and well perfused ?Neuro: Appropriate, no focal neurological deficits ? ?Most Recent Labs: ?Lab Results  ?Component Value Date  ? WBC 14.4 (H) 07/31/2021  ? HGB 15.6 07/31/2021  ? HCT 48.2 07/31/2021  ? PLT 375 07/31/2021  ? ? ?Lab Results  ?Component Value Date  ? NA 136 07/31/2021  ? K 3.9 07/31/2021  ? CL 102 07/31/2021  ? CO2 24 07/31/2021  ? BUN 21 (H) 07/31/2021  ? CREATININE 1.61 (H) 07/31/2021  ? CALCIUM 9.3 07/31/2021  ? ? ?No results found for: INR, APTT ? ? ?IMAGING: ?CT  Renal Stone Study ? ?Result Date: 07/31/2021 ?CLINICAL DATA:  Left flank pain x3 days.  Concern for renal stone. EXAM: CT ABDOMEN AND PELVIS WITHOUT CONTRAST TECHNIQUE: Multidetector CT imaging of the abdomen and pelvis was performed following the standard protocol without IV contrast. RADIATION DOSE REDUCTION: This exam was performed according to the departmental dose-optimization program which includes automated exposure control, adjustment of the mA and/or kV according to patient size and/or use of iterative reconstruction technique. COMPARISON:  MRI lumbar spine March 23, 2020 FINDINGS: Lower chest: No acute abnormality. Hepatobiliary: No suspicious hepatic lesion. Gallbladder is unremarkable. No biliary ductal dilation. Pancreas: No pancreatic ductal dilation or evidence of acute inflammation. Spleen: No splenomegaly or focal splenic lesion. Adrenals/Urinary Tract: Bilateral adrenal glands appear normal. Left-sided hydroureteronephrosis to the level of 2 stacked stones in the distal ureter at the ureterovesicular junction measuring up to 3 mm. There is periureteric and perinephric stranding which likely reflects sequela of stone passage however superimposed infection would appears similar. Right kidney is unremarkable. Urinary bladder is unremarkable for degree of distension. Stomach/Bowel: No enteric contrast was administered. Stomach is unremarkable for degree of distension. No pathologic dilation of small or large bowel. The appendix and terminal ileum appear normal. No evidence of acute bowel inflammation. Vascular/Lymphatic: Normal caliber abdominal aorta. No pathologically enlarged abdominal or pelvic lymph nodes. Reproductive: Prostate is unremarkable. Other: No pneumoperitoneum.  No walled off fluid collections. Musculoskeletal: Lumbar spondylosis.  No acute osseous abnormality. IMPRESSION: Left-sided hydroureteronephrosis to the level of 2 stacked stones in the distal ureter at the ureterovesicular  junction measuring up to 3 mm. Periureteric and perinephric stranding which likely reflects sequela of stone passage however superimposed infection would appears similar, correlation with laboratory values suggested. Electronically Signed   By: Dahlia Bailiff M.D.   On: 07/31/2021 18:41   ? ?------ ? ?Assessment: ? ?41 y.o. male who presented with flank pain, fevers and CT that demonstrates 67mm left UVJ obstructing stone with associated hydronephrosis.  ? ?Concerning for infection given: fever to 101.4 and leukocytosis to 14.4 ?In the ED received: CTX 1 g ? ?We discussed the indications for acute intervention including infected obstruction, bilateral ureteral obstruction or unilateral obstruction of solitary kidney as well as other less urgent indications for decompression which would included intractable pain, N/V, and acute renal injury. We also discussed the possible inability to place a ureteral stent in which case, the next intervention recommended would be a percutaneous nephrostomy tube. Given infected obstructing stone is present, plan for left ureteral stent placement. ? ? ?Recommendations: ?Plan for emergent left ureteral stent placement ?Case posted ?Keep patient NPO ?Consent obtained from patient ?Continue CTX for antibiotics, narrow antibiotics as possible based off culture results ? ? ?  Thank you for this consult. Please contact the urology consult pager with any further questions/concerns. ? ?Reola Mosher, MD ?[facility] Urology Resident, PGY4 ?[facility] ? ?I performed a history and physical examination of the patient and discussed the patients management with the resident.  I reviewed the resident's note and agree with the documented findings and plan of care.  ?

## 2021-07-31 NOTE — ED Triage Notes (Signed)
Pt c/o left flank pain that started x 3 days ago ?

## 2021-07-31 NOTE — Op Note (Signed)
Preoperative Diagnosis: Left nephrolithiasis ? ?Postoperative Diagnosis:  Same ? ?Procedure(s) Performed:   ?- Cystourethroscopy ?- Left retrograde pyelogram ?- Left ureteral stent placement ?- Intraoperative fluoroscopy with interpretation <1hr.  ? ?Teaching Surgeon:  Karoline Caldwell, MD ? ?Resident Surgeon:  Reola Mosher, MD ? ?Assistant(s):  None ? ?Anesthesia:  General ? ?Fluids:  See anesthesia record ? ?Estimated blood loss:  0cc ? ?Specimens:  None ? ?Drains:  Left 6Fr x 28cm JJ ureteral stent without dangler ? ?Complications:  None ? ?Indications: 41 y.o. patient with a history of left flank pain, fever, and 3 mm left UVJ stone. Risks & benefits of the procedure discussed with the patient, who wishes to proceed. ? ?Findings:  Normal cystourethroscopy, high riding bladder neck. Left RPG with filling defect in distal ureter. Successful placement of 6Fx28cm JJ stent without string. Draining urine cloudy with mild debris.  ? ?Description:  The patient was correctly identified in the preop holding area where written informed consent as well potential risk and complication reviewed. The patient agreed. They were brought back to the operative suite where a preinduction timeout was performed. Once correct information was verified, general anesthesia was induced. They were then gently placed into dorsal lithotomy position with SCDs in place for VTE prophylaxis. They were prepped and draped in the usual sterile fashion and given appropriate preoperative antibiotics. A second timeout was then performed.  ? ?We inserted a 26F rigid cystoscope per urethra with copious lubrication and normal saline irrigation running. This demonstrated findings as described above.   ? ?We cannulated the left ureteral orifice with the combination of a sensor wire and 5Fr open ended catheter. We were able to visualize a radioopacity on fluoroscopy in the distal left ureter. A retrograde pyelogram was performed with findings as noted  above. We then replaced the sensor wire into the left kidney and the 5Fr open ended catheter was removed. We then removed the cystoscope leaving our sensor wire in place. ? ?We then advanced a 6Fr x 28 cm JJ ureteral stent without dangler with the assistance of a stent pusher under direct fluoroscopic & visual guidance without difficultly.  Sensor wire removal demonstrated satisfactory stent curl proximally in the renal pelvis and distally in the bladder. The bladder was emptied and all instrumentation was removed. The patient was woken up from anesthesia and taken to the recovery unit for routine postoperative care. ? ? ?Post Op Plan:   ?1. Admit for observation ?2. Continue antibiotics. Follow up cultures and narrow antibiotics as appropriate ?3. Will need ureteroscopy in the future as an outpatient for definitive stone management ? ? ?Attestation:  Dr. Benancio Deeds was present and scrubbed for the entirety of the procedure.   ? ?Reola Mosher, MD ?[facility] Urology Resident, PGY4 ?[facility] ? ? ? ? ? ? ?

## 2021-07-31 NOTE — Anesthesia Preprocedure Evaluation (Signed)
Anesthesia Evaluation  ?Patient identified by MRN, date of birth, ID band ?Patient awake ? ? ? ?Reviewed: ?Allergy & Precautions, NPO status , Patient's Chart, lab work & pertinent test results ? ?Airway ?Mallampati: II ? ?TM Distance: >3 FB ?Neck ROM: Full ? ? ? Dental ?no notable dental hx. ? ?  ?Pulmonary ?neg pulmonary ROS, Current Smoker and Patient abstained from smoking.,  ?  ?Pulmonary exam normal ?breath sounds clear to auscultation ? ? ? ? ? ? Cardiovascular ?negative cardio ROS ?Normal cardiovascular exam ?Rhythm:Regular Rate:Normal ? ? ?  ?Neuro/Psych ?negative neurological ROS ? negative psych ROS  ? GI/Hepatic ?negative GI ROS, Neg liver ROS,   ?Endo/Other  ?negative endocrine ROS ? Renal/GU ?Renal disease  ?negative genitourinary ?  ?Musculoskeletal ?negative musculoskeletal ROS ?(+)  ? Abdominal ?(+) + obese,   ?Peds ?negative pediatric ROS ?(+)  Hematology ?negative hematology ROS ?(+)   ?Anesthesia Other Findings ? ? Reproductive/Obstetrics ?negative OB ROS ? ?  ? ? ? ? ? ? ? ? ? ? ? ? ? ?  ?  ? ? ? ? ? ? ? ? ?Anesthesia Physical ?Anesthesia Plan ? ?ASA: 2 and emergent ? ?Anesthesia Plan: General  ? ?Post-op Pain Management:   ? ?Induction: Intravenous ? ?PONV Risk Score and Plan: 1 and Ondansetron and Treatment may vary due to age or medical condition ? ?Airway Management Planned: LMA ? ?Additional Equipment:  ? ?Intra-op Plan:  ? ?Post-operative Plan: Extubation in OR ? ?Informed Consent: I have reviewed the patients History and Physical, chart, labs and discussed the procedure including the risks, benefits and alternatives for the proposed anesthesia with the patient or authorized representative who has indicated his/her understanding and acceptance.  ? ? ? ?Dental advisory given ? ?Plan Discussed with: CRNA ? ?Anesthesia Plan Comments:   ? ? ? ? ? ? ?Anesthesia Quick Evaluation ? ?

## 2021-07-31 NOTE — Anesthesia Procedure Notes (Signed)
Procedure Name: LMA Insertion ?Date/Time: 07/31/2021 11:26 PM ?Performed by: Basilio Cairo, CRNA ?Pre-anesthesia Checklist: Patient identified, Patient being monitored, Timeout performed, Emergency Drugs available and Suction available ?Patient Re-evaluated:Patient Re-evaluated prior to induction ?Oxygen Delivery Method: Circle system utilized ?Preoxygenation: Pre-oxygenation with 100% oxygen ?Induction Type: IV induction ?Ventilation: Mask ventilation without difficulty ?LMA: LMA inserted ?LMA Size: 4.0 ?Tube type: Oral ?Number of attempts: 1 ?Placement Confirmation: positive ETCO2 and breath sounds checked- equal and bilateral ?Tube secured with: Tape ?Dental Injury: Teeth and Oropharynx as per pre-operative assessment  ? ? ? ? ?

## 2021-08-01 ENCOUNTER — Telehealth: Payer: Self-pay

## 2021-08-01 ENCOUNTER — Encounter (HOSPITAL_COMMUNITY): Payer: Self-pay | Admitting: Urology

## 2021-08-01 DIAGNOSIS — N390 Urinary tract infection, site not specified: Secondary | ICD-10-CM | POA: Diagnosis not present

## 2021-08-01 DIAGNOSIS — A419 Sepsis, unspecified organism: Secondary | ICD-10-CM | POA: Diagnosis not present

## 2021-08-01 LAB — COMPREHENSIVE METABOLIC PANEL
ALT: 31 U/L (ref 0–44)
AST: 19 U/L (ref 15–41)
Albumin: 4 g/dL (ref 3.5–5.0)
Alkaline Phosphatase: 89 U/L (ref 38–126)
Anion gap: 5 (ref 5–15)
BUN: 17 mg/dL (ref 6–20)
CO2: 27 mmol/L (ref 22–32)
Calcium: 9.2 mg/dL (ref 8.9–10.3)
Chloride: 105 mmol/L (ref 98–111)
Creatinine, Ser: 1.21 mg/dL (ref 0.61–1.24)
GFR, Estimated: >60 mL/min (ref >60)
Glucose, Bld: 169 mg/dL — ABNORMAL HIGH (ref 70–99)
Potassium: 4.6 mmol/L (ref 3.5–5.1)
Sodium: 137 mmol/L (ref 135–145)
Total Bilirubin: 0.6 mg/dL (ref 0.3–1.2)
Total Protein: 7.4 g/dL (ref 6.5–8.1)

## 2021-08-01 LAB — CBC
HCT: 46.1 % (ref 39.0–52.0)
Hemoglobin: 15.1 g/dL (ref 13.0–17.0)
MCH: 29.9 pg (ref 26.0–34.0)
MCHC: 32.8 g/dL (ref 30.0–36.0)
MCV: 91.3 fL (ref 80.0–100.0)
Platelets: 320 10*3/uL (ref 150–400)
RBC: 5.05 MIL/uL (ref 4.22–5.81)
RDW: 13 % (ref 11.5–15.5)
WBC: 10.5 10*3/uL (ref 4.0–10.5)
nRBC: 0 % (ref 0.0–0.2)

## 2021-08-01 LAB — MAGNESIUM: Magnesium: 2.2 mg/dL (ref 1.7–2.4)

## 2021-08-01 LAB — HIV ANTIBODY (ROUTINE TESTING W REFLEX): HIV Screen 4th Generation wRfx: NONREACTIVE

## 2021-08-01 LAB — PHOSPHORUS: Phosphorus: 2.7 mg/dL (ref 2.5–4.6)

## 2021-08-01 MED ORDER — CEFADROXIL 500 MG PO CAPS: 500.0000 mg | ORAL_CAPSULE | Freq: Two times a day (BID) | ORAL | 0 refills | Status: AC

## 2021-08-01 MED ORDER — HYDROCODONE-ACETAMINOPHEN 5-325 MG PO TABS
1.0000 | ORAL_TABLET | ORAL | Status: DC | PRN
  Administered 2021-08-01: 1 via ORAL
  Filled 2021-08-01: qty 1

## 2021-08-01 MED ORDER — OXYCODONE HCL 5 MG PO TABS
5.0000 mg | ORAL_TABLET | Freq: Once | ORAL | Status: AC
Start: 2021-08-01 — End: 2021-08-01

## 2021-08-01 MED ORDER — HYDROMORPHONE HCL 1 MG/ML IJ SOLN
INTRAMUSCULAR | Status: AC
  Administered 2021-08-01: 0.25 mg via INTRAVENOUS
  Filled 2021-08-01: qty 1

## 2021-08-01 MED ORDER — HYDROMORPHONE HCL 1 MG/ML IJ SOLN: 0.2500 mg | Freq: Once | INTRAMUSCULAR | Status: AC

## 2021-08-01 MED ORDER — CYCLOBENZAPRINE HCL 10 MG PO TABS
10.0000 mg | ORAL_TABLET | Freq: Three times a day (TID) | ORAL | Status: DC | PRN
  Administered 2021-08-01: 10 mg via ORAL
  Filled 2021-08-01: qty 1

## 2021-08-01 MED ORDER — OXYCODONE HCL 5 MG PO TABS
ORAL_TABLET | ORAL | Status: AC
  Administered 2021-08-01: 5 mg via ORAL
  Filled 2021-08-01: qty 1

## 2021-08-01 NOTE — Hospital Course (Signed)
40 y.o. male with medical history significant of tobacco use  obesity presented with 3-day onset of left flank pain and seen in ED-initially febrile and tachycardic, BP was 156/102 and O2 sat was 99-100% on room air.  Work-up in the ED showed normal CBC except for leukocytosis, BMP was normal except for BUN/creatinine 21/1.61 (baseline creatinine at 1.1).  Influenza A, B, SARS coronavirus 2 was negative. ? CT abdomen and pelvis without contrast showed left-sided hydroureteronephrosis to the level of 2 stacked stones in the distal ureter at the ureterovesicular junction measuring up to 3 mm. Periureteric and perinephric stranding which likely reflects sequela of stone passage however superimposed infection would appears similar. She was treated with IV ceftriaxone  sent to [facility] and underwent left ureteral stent placement for 3 mm left obstructing UVJ stone and infection. ?Post op doing well, afebrile, diet advanced. Urology advised and will plan for outpatient follow up for definitive stone treatment and stent removal. ?

## 2021-08-01 NOTE — Progress Notes (Addendum)
Urology Progress Note  ? ?1 Day Post-Op from left ureteral stent placement.  ? ?Subjective: ?NAEON.  ?Pain improved ?Still some left flank discomfort with voiding which is normal in the setting of a refluxing stent ?Expected hematuria following instrumentation ?No fevers post-op ? ?Objective: ?Vital signs in last 24 hours: ?Temp:  [98.1 ?F (36.7 ?C)-101.4 ?F (38.6 ?C)] 98.1 ?F (36.7 ?C) (04/21 4696) ?Pulse Rate:  [61-102] 78 (04/21 0528) ?Resp:  [16-22] 18 (04/21 0528) ?BP: (112-164)/(63-102) 134/66 (04/21 0528) ?SpO2:  [92 %-100 %] 96 % (04/21 0528) ?Weight:  [112.5 kg] 112.5 kg (04/20 1736) ? ?Intake/Output from previous day: ?04/20 0701 - 04/21 0700 ?In: 2950 [P.O.:600; I.V.:1250; IV Piggyback:1100] ?Out: 1300 [Urine:1300] ?Intake/Output this shift: ?No intake/output data recorded. ? ?Physical Exam:  ?General: Alert and oriented ?CV: Regular rate ?Lungs: No increased work of breathing ?Abdomen: Soft, non-tender, non-distended ?Ext: NT, No erythema ? ?Lab Results: ?Recent Labs  ?  07/31/21 ?1750  ?HGB 15.6  ?HCT 48.2  ? ?Recent Labs  ?  07/31/21 ?1750  ?NA 136  ?K 3.9  ?CL 102  ?CO2 24  ?GLUCOSE 115*  ?BUN 21*  ?CREATININE 1.61*  ?CALCIUM 9.3  ? ? ?Studies/Results: ?DG C-Arm 1-60 Min-No Report ? ?Result Date: 07/31/2021 ?Fluoroscopy was utilized by the requesting physician.  No radiographic interpretation.  ? ?CT Renal Stone Study ? ?Result Date: 07/31/2021 ?CLINICAL DATA:  Left flank pain x3 days.  Concern for renal stone. EXAM: CT ABDOMEN AND PELVIS WITHOUT CONTRAST TECHNIQUE: Multidetector CT imaging of the abdomen and pelvis was performed following the standard protocol without IV contrast. RADIATION DOSE REDUCTION: This exam was performed according to the departmental dose-optimization program which includes automated exposure control, adjustment of the mA and/or kV according to patient size and/or use of iterative reconstruction technique. COMPARISON:  MRI lumbar spine March 23, 2020 FINDINGS: Lower chest:  No acute abnormality. Hepatobiliary: No suspicious hepatic lesion. Gallbladder is unremarkable. No biliary ductal dilation. Pancreas: No pancreatic ductal dilation or evidence of acute inflammation. Spleen: No splenomegaly or focal splenic lesion. Adrenals/Urinary Tract: Bilateral adrenal glands appear normal. Left-sided hydroureteronephrosis to the level of 2 stacked stones in the distal ureter at the ureterovesicular junction measuring up to 3 mm. There is periureteric and perinephric stranding which likely reflects sequela of stone passage however superimposed infection would appears similar. Right kidney is unremarkable. Urinary bladder is unremarkable for degree of distension. Stomach/Bowel: No enteric contrast was administered. Stomach is unremarkable for degree of distension. No pathologic dilation of small or large bowel. The appendix and terminal ileum appear normal. No evidence of acute bowel inflammation. Vascular/Lymphatic: Normal caliber abdominal aorta. No pathologically enlarged abdominal or pelvic lymph nodes. Reproductive: Prostate is unremarkable. Other: No pneumoperitoneum.  No walled off fluid collections. Musculoskeletal: Lumbar spondylosis.  No acute osseous abnormality. IMPRESSION: Left-sided hydroureteronephrosis to the level of 2 stacked stones in the distal ureter at the ureterovesicular junction measuring up to 3 mm. Periureteric and perinephric stranding which likely reflects sequela of stone passage however superimposed infection would appears similar, correlation with laboratory values suggested. Electronically Signed   By: Maudry Mayhew M.D.   On: 07/31/2021 18:41   ? ?Assessment/Plan: ? ?41 y.o. male s/p left ureteral stent placement for 3 mm left obstructing UVJ stone and infection.  Overall doing well post-op.  ? ?- Advance diet as tolerated ?- Follow up cultures and narrow antibiotics as appropriate ?- Will set up for outpatient follow up for definitive stone treatment and stent  removal ?- Urology  will signoff ? ?Reola Mosher, MD ?[facility] Urology Resident, PGY4 ?[facility] ? ?I performed a history and physical examination of the patient and discussed the patients management with the resident.  I reviewed the resident's note and agree with the documented findings and plan of care.  ? LOS: 1 day  ?Will need ureteroscopy in 1-2 weeks as outpatient, will facilitate ?

## 2021-08-01 NOTE — Telephone Encounter (Signed)
Transition Care Management Follow-up Telephone Call ?Date of discharge and from where: 08/01/21 at [facility] ?How have you been since you were released from the hospital? Pt states he feels better. Pt states he has pain with urinating. ?Any questions or concerns? No ? ?Items Reviewed: ?Did the pt receive and understand the discharge instructions provided? Yes  ?Medications obtained and verified? Yes  ?Any new allergies since your discharge? No  ?Dietary orders reviewed? Yes ?Do you have support at home? Yes  ? ?Home Care and Equipment/Supplies: ?Were home health services ordered? not applicable ? ? ?Functional Questionnaire: (I = Independent and D = Dependent) ?ADLs: I ? ?Bathing/Dressing- I ? ?Meal Prep- I ? ?Eating- I ? ?Maintaining continence- I ? ?Transferring/Ambulation- I ? ?Managing Meds- I ? ?Follow up appointments reviewed: ? ?PCP Hospital f/u appt confirmed? Yes  Scheduled to see Dr Clent Ridges on 08/06/21 @ 8a. ?Specialist Hospital f/u appt confirmed? No  Pt is going to call urologist on Mon to get appt scheduled for 10 days postop. ?Are transportation arrangements needed? No  ?If their condition worsens, is the pt aware to call PCP or go to the Emergency Dept.? Yes ?Was the patient provided with contact information for the PCP's office or ED? Yes ?Was to pt encouraged to call back with questions or concerns? Yes  ?

## 2021-08-01 NOTE — Progress Notes (Signed)
Pt is ready for discharge.  PIV removed.  Remains alert and oriented.  AVS given.  Pt able to verbalize understanding of all discharge instructions, including follow up care for stent removal.  All questions answered.  Pt currently awaiting on arrival of wife to drive him home. ?

## 2021-08-01 NOTE — Discharge Summary (Signed)
Physician Discharge Summary  ?Maurice Morales SAY:[id number] DOB: 1980/07/30 DOA: 07/31/2021 ? ?PCP: Nelwyn Salisbury, MD ? ?Admit date: 07/31/2021 ?Discharge date: 08/01/2021 ?Recommendations for Outpatient Follow-up:  ?Follow up with PCP and urology in 1 weeks-call for appointment ?Please obtain BMP/CBC in one week ? ?Discharge Dispo: home ?Discharge Condition: Stable ?Code Status:   Code Status: Full Code ?Diet recommendation:  ?Diet Order   ? ?       ?  Diet regular Room service appropriate? Yes; Fluid consistency: Thin  Diet effective now       ?  ? ?  ?  ? ?  ?  ? ?Brief/Interim Summary: ?41 y.o. male with medical history significant of tobacco use  obesity presented with 3-day onset of left flank pain and seen in ED-initially febrile and tachycardic, BP was 156/102 and O2 sat was 99-100% on room air.  Work-up in the ED showed normal CBC except for leukocytosis, BMP was normal except for BUN/creatinine 21/1.61 (baseline creatinine at 1.1).  Influenza A, B, SARS coronavirus 2 was negative. ? CT abdomen and pelvis without contrast showed left-sided hydroureteronephrosis to the level of 2 stacked stones in the distal ureter at the ureterovesicular junction measuring up to 3 mm. Periureteric and perinephric stranding which likely reflects sequela of stone passage however superimposed infection would appears similar. She was treated with IV ceftriaxone  sent to [facility] and underwent left ureteral stent placement for 3 mm left obstructing UVJ stone and infection. ?Post op doing well, afebrile, diet advanced. Urology advised and will plan for outpatient follow up for definitive stone treatment and stent removal.  ?Discussed with Dr. Benancio Deeds this morning okay for discharge home on oral Duricef. ? ?Discharge Diagnoses:  ?Sepsis secondary to UTI  3 mm left obstructing UVJ stone and infection: underwent left ureteral stent placement.  Postop doing well, leukocytosis resolved, discussed with urology, no urine culture sent from ED, so  okay to discharge on Duricef, will follow-up outpatient for stent removal and definitive treatment of stone. ?AKI-resolved with IV fluids. 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  Some difficulty with urination as expected. ? ?Discharge Exam: ?Vitals:  ? 08/01/21 0414 08/01/21 0528  ?BP: 123/77 134/66  ?Pulse: 61 78  ?Resp: 18 18  ?Temp: 98.3 ?F (36.8 ?C) 98.1 ?F (36.7 ?C)  ?SpO2: 94% 96%  ? ?General: Pt is alert, awake, not in acute distress ?Cardiovascular: RRR, S1/S2 +, no rubs, no gallops ?Respiratory: CTA bilaterally, no wheezing, no rhonchi ?Abdominal: Soft, NT, ND, bowel sounds + ?Extremities: no edema, no cyanosis ? ?Discharge Instructions ? ?Discharge Instructions   ? ? Discharge instructions   Complete by: As directed ?  ? Call urology office for appointment to remove the stent and for definitive treatment of kidney stones. ?Please call call MD or return to ER for similar or worsening recurring problem that brought you to hospital or if any fever,nausea/vomiting,abdominal pain, uncontrolled pain, chest pain,  shortness of breath or any other alarming symptoms. ? ?Please follow-up your doctor as instructed in a week time and call the office for appointment. ? ?Please avoid alcohol, smoking, or any other illicit substance and maintain healthy habits including taking your regular medications as prescribed. ? ?You were cared for by a hospitalist during your hospital stay. If you have any questions about your discharge medications or the care you received while you were in the hospital after you  are discharged, you can call the unit and ask to speak with the hospitalist on call if the hospitalist that took care of you is not available. ? ?Once you are discharged, your primary care physician will handle any further medical issues. Please note that NO REFILLS for any discharge  medications will be authorized once you are discharged, as it is imperative that you return to your primary care physician (or establish a relationship with a primary care physician if you do not have one) for your aftercare needs so that they can reassess your need for medications and monitor your lab values  ? Increase activity slowly   Complete by: As directed ?  ? ?  ? ?Allergies as of 08/01/2021   ?No Known Allergies ?  ? ?  ?Medication List  ?  ? ?TAKE these medications   ? ?BC HEADACHE POWDER PO ?Take 1-2 packets by mouth daily. ?  ?cefadroxil 500 MG capsule ?Commonly known as: DURICEF ?Take 1 capsule (500 mg total) by mouth 2 (two) times daily for 7 days. ?  ?cyclobenzaprine 10 MG tablet ?Commonly known as: FLEXERIL ?Take 1 tablet (10 mg total) by mouth 3 (three) times daily as needed for muscle spasms. ?  ?ibuprofen 200 MG tablet ?Commonly known as: ADVIL ?Take 400 mg by mouth every 6 (six) hours as needed. ?  ? ?  ? ? Follow-up Information   ? ? Nelwyn Salisbury, MD Follow up in 1 week(s).   ?Specialty: Family Medicine ?Contact information: ?[address] ?[place] [address] ?[phone] ? ? ?  ?  ? ?  ?  ? ?  ? ?No Known Allergies ? ?The results of significant diagnostics from this hospitalization (including imaging, microbiology, ancillary and laboratory) are listed below for reference.   ? ?Microbiology: ?Recent Results (from the past 240 hour(s))  ?Resp Panel by RT-PCR (Flu A&B, Covid) Nasopharyngeal Swab     Status: None  ? Collection Time: 07/31/21  6:00 PM  ? Specimen: Nasopharyngeal Swab; Nasopharyngeal(NP) swabs in vial transport medium  ?Result Value Ref Range Status  ? SARS Coronavirus 2 by RT PCR NEGATIVE NEGATIVE Final  ?  Comment: (NOTE) ?SARS-CoV-2 target nucleic acids are NOT DETECTED. ? ?The SARS-CoV-2 RNA is generally detectable in upper respiratory ?specimens during the acute phase of infection. The lowest ?concentration of SARS-CoV-2 viral copies this assay can detect is ?138  copies/mL. A negative result does not preclude SARS-Cov-2 ?infection and should not be used as the sole basis for treatment or ?other patient management decisions. A negative result may occur with  ?improper specimen collection/handling, submission of specimen other ?than nasopharyngeal swab, presence of viral mutation(s) within the ?areas targeted by this assay, and inadequate number of viral ?copies(<138 copies/mL). A negative result must be combined with ?clinical observations, patient history, and epidemiological ?information. The expected result is Negative. ? ?Fact Sheet for Patients:  ?BloggerCourse.com ? ?Fact Sheet for Healthcare Providers:  ?SeriousBroker.it ? ?This test is no t yet approved or cleared by the [place] FDA and  ?has been authorized for detection and/or diagnosis of SARS-CoV-2 by ?FDA under an Emergency Use Authorization (EUA). This EUA will remain  ?in effect (meaning this test can be used) for the duration of the ?COVID-19 declaration under Section 564(b)(1) of the Act, 21 ?U.S.C.section 360bbb-3(b)(1), unless the authorization is terminated  ?or revoked sooner.  ? ? ?  ? Influenza A by PCR NEGATIVE NEGATIVE Final  ? Influenza B by PCR NEGATIVE NEGATIVE Final  ?  Comment: (NOTE) ?The Xpert Xpress SARS-CoV-2/FLU/RSV plus assay is intended as an aid ?in the diagnosis of influenza from Nasopharyngeal swab specimens and ?should not be used as a sole basis for treatment. Nasal washings and ?aspirates are unacceptable for Xpert Xpress SARS-CoV-2/FLU/RSV ?testing. ? ?Fact Sheet for Patients: ?BloggerCourse.comhttps://www.fda.gov/media/152166/download ? ?Fact Sheet for Healthcare Providers: ?SeriousBroker.ithttps://www.fda.gov/media/152162/download ? ?This test is not yet approved or cleared by the [place] FDA and ?has been authorized for detection and/or diagnosis of SARS-CoV-2 by ?FDA under an Emergency Use Authorization (EUA). This EUA will remain ?in effect (meaning  this test can be used) for the duration of the ?COVID-19 declaration under Section 564(b)(1) of the Act, 21 U.S.C. ?section 360bbb-3(b)(1), unless the authorization is terminated or ?revoked. ? ?Performed at A

## 2021-08-01 NOTE — Plan of Care (Signed)

## 2021-08-04 NOTE — Anesthesia Postprocedure Evaluation (Signed)
Anesthesia Post Note ? ?Patient: Maurice Morales ? ?Procedure(s) Performed: CYSTOSCOPY WITH RETROGRADE PYELOGRAM/URETERAL STENT PLACEMENT (Left: Urethra) ? ?  ? ?Patient location during evaluation: PACU ?Anesthesia Type: General ?Level of consciousness: awake and alert ?Pain management: pain level controlled ?Vital Signs Assessment: post-procedure vital signs reviewed and stable ?Respiratory status: spontaneous breathing, nonlabored ventilation and respiratory function stable ?Cardiovascular status: blood pressure returned to baseline and stable ?Postop Assessment: no apparent nausea or vomiting ?Anesthetic complications: no ? ? ?No notable events documented. ? ?Last Vitals:  ?Vitals:  ? 08/01/21 0414 08/01/21 0528  ?BP: 123/77 134/66  ?Pulse: 61 78  ?Resp: 18 18  ?Temp: 36.8 ?C 36.7 ?C  ?SpO2: 94% 96%  ?  ?Last Pain:  ?Vitals:  ? 08/01/21 1040  ?TempSrc:   ?PainSc: 3   ? ? ?  ?  ?  ?  ?  ?  ? ?Lowella Curb ? ? ? ? ?

## 2021-08-06 ENCOUNTER — Ambulatory Visit (INDEPENDENT_AMBULATORY_CARE_PROVIDER_SITE_OTHER): Payer: Managed Care, Other (non HMO) | Admitting: Family Medicine

## 2021-08-06 ENCOUNTER — Encounter: Payer: Self-pay | Admitting: Family Medicine

## 2021-08-06 VITALS — BP 118/78 | HR 61 | Temp 98.6°F | Wt 256.0 lb

## 2021-08-06 DIAGNOSIS — N179 Acute kidney failure, unspecified: Secondary | ICD-10-CM

## 2021-08-06 DIAGNOSIS — Z862 Personal history of diseases of the blood and blood-forming organs and certain disorders involving the immune mechanism: Secondary | ICD-10-CM

## 2021-08-06 DIAGNOSIS — A419 Sepsis, unspecified organism: Secondary | ICD-10-CM

## 2021-08-06 DIAGNOSIS — N132 Hydronephrosis with renal and ureteral calculous obstruction: Secondary | ICD-10-CM

## 2021-08-06 DIAGNOSIS — N390 Urinary tract infection, site not specified: Secondary | ICD-10-CM

## 2021-08-06 LAB — BASIC METABOLIC PANEL
BUN: 15 mg/dL (ref 6–23)
CO2: 29 mEq/L (ref 19–32)
Calcium: 9.3 mg/dL (ref 8.4–10.5)
Chloride: 103 mEq/L (ref 96–112)
Creatinine, Ser: 0.99 mg/dL (ref 0.40–1.50)
GFR: 93.58 mL/min (ref >60.00)
Glucose, Bld: 141 mg/dL — ABNORMAL HIGH (ref 70–99)
Potassium: 4.3 mEq/L (ref 3.5–5.1)
Sodium: 138 mEq/L (ref 135–145)

## 2021-08-06 NOTE — Progress Notes (Signed)
? ?  Subjective:  ? ? Patient ID: Maurice Morales, male    DOB: 1980-11-07, 41 y.o.   MRN: [id number] ? ?HPI ?Here for a transitional follow up from a hospital stay from 07-31-21 to 08-01-21. He presented with left flank pain and fever, and he was found to have left hydronephrosis from an obstructing 3 mm left ureteral stone. He was septic from a UTI. No urine culture was obtained. He underwent a cystoscopy with placement of a left ureteral stent, and he was sent home on 7 days of Cefadroxil. His WBC on admission was 14.4, down to 10.5 at DC. His admission creatinine was 1.61, down to 1.21 at DC. Since going home he has felt fairly well and he is urinating freely. He does feel some mild left flank pain at the end of urinations. No fever or nausea. His appetite is normal and his bowels are moving normally. He is back at work, but he is avoiding any heavy lifting.  ? ? ? ?Review of Systems  ?Constitutional: Negative.   ?Respiratory: Negative.    ?Cardiovascular: Negative.   ?Gastrointestinal: Negative.   ?Genitourinary:  Positive for flank pain. Negative for difficulty urinating, dysuria, hematuria and urgency.  ? ?   ?Objective:  ? Physical Exam ?Constitutional:   ?   General: He is not in acute distress. ?   Appearance: Normal appearance.  ?Cardiovascular:  ?   Rate and Rhythm: Normal rate and regular rhythm.  ?   Pulses: Normal pulses.  ?   Heart sounds: Normal heart sounds.  ?Pulmonary:  ?   Effort: Pulmonary effort is normal.  ?   Breath sounds: Normal breath sounds.  ?Abdominal:  ?   General: Abdomen is flat. Bowel sounds are normal. There is no distension.  ?   Palpations: Abdomen is soft. There is no mass.  ?   Tenderness: There is no abdominal tenderness. There is no right CVA tenderness, left CVA tenderness, guarding or rebound.  ?   Hernia: No hernia is present.  ?Neurological:  ?   Mental Status: He is alert.  ? ? ? ? ? ?   ?Assessment & Plan:  ?Transitional visit after a bout of urosepsis from an obstructing  left ureteral stone. He is doing well, and he will see Dr. Milford Cage in Urology tomorrow. He had some AKI when he was admitted, so we will check another BMET today. His pain control is good.  ?Alysia Penna, MD ? ? ?

## 2021-08-08 ENCOUNTER — Other Ambulatory Visit: Payer: Self-pay | Admitting: Urology

## 2021-08-11 ENCOUNTER — Encounter (HOSPITAL_BASED_OUTPATIENT_CLINIC_OR_DEPARTMENT_OTHER): Payer: Self-pay | Admitting: Urology

## 2021-08-11 ENCOUNTER — Other Ambulatory Visit: Payer: Self-pay

## 2021-08-11 NOTE — H&P (Signed)
cc: Urolithiasis  ? ?HPI: 41 year old man who presents today for evaluation and management of left-sided UVJ calculus. He was admitted to the hospital for emergent stent placement due to fever secondary to left-sided obstructing stone. He was given IV antibiotics and a emergent stent was placed. He reports that he has been tolerating the stent well although does have occasional discomfort with voiding. He has had no interval fevers since leaving the hospital. He denies chills. This is his first stone event. He does not take any medications. He has been using ibuprofen occasionally for pain but has not required it daily.  ? ?  ?ALLERGIES: None  ? ?MEDICATIONS: Cefadroxil  ?  ? ?GU PSH: Cystoscopy Insert Stent, Left - 07/31/2021 - Calculus of ureter (N20.1) ? ?  ? ?NON-GU PSH: None  ? ?GU PMH: No GU PMH   ? ?NON-GU PMH: No Non-GU PMH   ? ?FAMILY HISTORY: No Family History   ? ?SOCIAL HISTORY: No Social History   ? ?REVIEW OF SYSTEMS:    ?GU Review Male:   Patient reports frequent urination. Patient denies hard to postpone urination, burning/ pain with urination, get up at night to urinate, leakage of urine, stream starts and stops, trouble starting your stream, have to strain to urinate , erection problems, and penile pain.  ?Gastrointestinal (Upper):   Patient denies nausea, vomiting, and indigestion/ heartburn.  ?Gastrointestinal (Lower):   Patient denies constipation and diarrhea.  ?Constitutional:   Patient denies fever, night sweats, weight loss, and fatigue.  ?Skin:   Patient denies skin rash/ lesion and itching.  ?Eyes:   Patient denies blurred vision and double vision.  ?Hematologic/Lymphatic:   Patient denies swollen glands and easy bruising.  ?Musculoskeletal:   Patient reports back pain. Patient denies joint pain.  ?Neurological:   Patient denies headaches and dizziness.  ?Psychologic:   Patient denies depression and anxiety.  ? ?VITAL SIGNS:    ?  08/07/2021 10:11 AM  ?BP 124/82 mmHg  ?Pulse 62 /min   ?Temperature 98.4 F / 36.8 C  ? ?MULTI-SYSTEM PHYSICAL EXAMINATION:    ?Constitutional: Well-nourished. No physical deformities. Normally developed. Good grooming.  ?Cardiovascular: Normal temperature, normal extremity pulses, no swelling, no varicosities.  ?Skin: No paleness, no jaundice, no cyanosis. No lesion, no ulcer, no rash.  ?Neurologic / Psychiatric: Oriented to time, oriented to place, oriented to person. No depression, no anxiety, no agitation.  ?Gastrointestinal: No mass, no tenderness, no rigidity, non obese abdomen.  ? ?  ?Complexity of Data:  ?Source Of History:  Patient  ?Records Review:   Previous Hospital Records  ?Urine Test Review:   Urinalysis  ? 08/07/21  ?Urinalysis  ?Urine Appearance Slightly Cloudy   ?Urine Color Amber   ?Urine Glucose Neg mg/dL  ?Urine Bilirubin Neg mg/dL  ?Urine Ketones Neg mg/dL  ?Urine Specific Gravity 1.025   ?Urine Blood 3+ ery/uL  ?Urine pH 5.5   ?Urine Protein 2+ mg/dL  ?Urine Urobilinogen 2.0 mg/dL  ?Urine Nitrites Neg   ?Urine Leukocyte Esterase Trace leu/uL  ?Urine WBC/hpf 6 - 10/hpf   ?Urine RBC/hpf >60/hpf   ?Urine Epithelial Cells 0 - 5/hpf   ?Urine Bacteria Mod (26-50/hpf)   ?Urine Mucous Present   ?Urine Yeast NS (Not Seen)   ?Urine Trichomonas Not Present   ?Urine Cystals NS (Not Seen)   ?Urine Casts NS (Not Seen)   ?Urine Sperm Not Present   ? ?PROCEDURES:    ? ?     Urinalysis w/Scope ?Dipstick Dipstick Cont'd Micro  ?  Color: Amber Bilirubin: Neg mg/dL WBC/hpf: 6 - 73/ZHG  ?Appearance: Slightly Cloudy Ketones: Neg mg/dL RBC/hpf: >99/MEQ  ?Specific Gravity: 1.025 Blood: 3+ ery/uL Bacteria: Mod (26-50/hpf)  ?pH: 5.5 Protein: 2+ mg/dL Cystals: NS (Not Seen)  ?Glucose: Neg mg/dL Urobilinogen: 2.0 mg/dL Casts: NS (Not Seen)  ?  Nitrites: Neg Trichomonas: Not Present  ?  Leukocyte Esterase: Trace leu/uL Mucous: Present  ?    Epithelial Cells: 0 - 5/hpf  ?    Yeast: NS (Not Seen)  ?    Sperm: Not Present  ? ? ?ASSESSMENT:  ?    ICD-10 Details  ?1 GU:   Ureteral  calculus - N20.1 Left, Acute, Systemic Symptoms  ? ?PLAN:    ? ?      Orders ?Labs CULTURE, URINE  ? ? ?      Schedule ?Return Visit/Planned Activity: Next Available Appointment - Schedule Surgery  ? ? ?      Document ?Letter(s):  Created for Patient: Clinical Summary  ? ? ?     Notes:   Urinalysis was sent for culture today. We discussed definitive stone intervention and the patient was ready to proceed with ureteroscopy as previously advised by his urologist while in the hospital. Urinalysis will be sent for precautionary culture today. Stone intervention was discussed in detail today. For ureteroscopy, the patient understands that there is a chance for a staged procedure. Patient also understands that there is risk for bleeding, infection, injury to surrounding organs, and general risks of anesthesia. The patient also understands the placement of a stent and the risks of stent placement including, risk for infection, the risk for pain, and the risk for injury. For ESWL, the patient understands that there is a chance of failure of procedure, there is also a risk for bruising, infection, bleeding, and injury to surrounding structures. The patient verbalized understanding to these risks.  ? ?A surgical posting sheet was handed to the scheduler today. The patient was instructed on what to expect before, after and during surgery as well as stent discomfort and pain.  ? ? ?

## 2021-08-11 NOTE — Progress Notes (Signed)
Spoke w/ via phone for pre-op interview---Maurice Morales ?Lab needs dos----none                ?Lab results------08/06/2021 BMP in Epic, 08/01/2021 CBC, CMP in Epic ?COVID test -----patient states asymptomatic no test needed ?Arrive at -------1030 on 08/12/21 ?NPO after MN NO Solid Food.  Clear liquids from MN until---0930 ?Med rec completed ?Medications to take morning of surgery -----Flexeril prn ?Diabetic medication -----n/a ?You may take a shower, put on deodorant and brush your teeth the morning of surgery. ?Patient instructed no nail polish to be worn day of surgery. ?Patient instructed to bring photo id and insurance card day of surgery ?Patient aware to have Driver (ride ) / caregiver    for 24 hours after surgery - Maurice Morales, wife ?Patient Special Instructions -----No smoking for 24 hours before surgery. Do the best you can. ?Pre-Op special Istructions -----Requested second sign for surgeon's orders on 08/11/21 via Epic IB to Dr. Benancio Deeds. ?Patient verbalized understanding of instructions that were given at this phone interview. ?Patient denies shortness of breath, chest pain, fever, cough at this phone interview.  ?

## 2021-08-12 ENCOUNTER — Ambulatory Visit (HOSPITAL_BASED_OUTPATIENT_CLINIC_OR_DEPARTMENT_OTHER): Payer: Managed Care, Other (non HMO) | Admitting: Anesthesiology

## 2021-08-12 ENCOUNTER — Encounter (HOSPITAL_BASED_OUTPATIENT_CLINIC_OR_DEPARTMENT_OTHER): Payer: Self-pay | Admitting: Urology

## 2021-08-12 ENCOUNTER — Encounter (HOSPITAL_BASED_OUTPATIENT_CLINIC_OR_DEPARTMENT_OTHER)
Admission: RE | Disposition: A | Discharge status: Home / Self Care | Payer: Self-pay | Source: Home / Self Care | Attending: Urology

## 2021-08-12 ENCOUNTER — Ambulatory Visit (HOSPITAL_BASED_OUTPATIENT_CLINIC_OR_DEPARTMENT_OTHER)
Admission: RE | Admit: 2021-08-12 | Discharge: 2021-08-12 | Disposition: A | Discharge status: Home / Self Care | Payer: Managed Care, Other (non HMO) | Attending: Urology | Admitting: Urology

## 2021-08-12 ENCOUNTER — Other Ambulatory Visit: Payer: Self-pay

## 2021-08-12 DIAGNOSIS — F17200 Nicotine dependence, unspecified, uncomplicated: Secondary | ICD-10-CM | POA: Insufficient documentation

## 2021-08-12 DIAGNOSIS — N201 Calculus of ureter: Secondary | ICD-10-CM

## 2021-08-12 HISTORY — DX: Personal history of urinary calculi: Z87.442

## 2021-08-12 HISTORY — DX: Sepsis, unspecified organism: A41.9

## 2021-08-12 HISTORY — PX: CYSTOSCOPY WITH RETROGRADE PYELOGRAM, URETEROSCOPY AND STENT PLACEMENT: SHX5789

## 2021-08-12 HISTORY — PX: HOLMIUM LASER APPLICATION: SHX5852

## 2021-08-12 SURGERY — CYSTOURETEROSCOPY, WITH RETROGRADE PYELOGRAM AND STENT INSERTION
Anesthesia: General | Site: Ureter | Laterality: Left

## 2021-08-12 MED ORDER — DEXAMETHASONE SODIUM PHOSPHATE 4 MG/ML IJ SOLN
INTRAMUSCULAR | Status: DC | PRN
  Administered 2021-08-12: 10 mg via INTRAVENOUS

## 2021-08-12 MED ORDER — CEFAZOLIN SODIUM-DEXTROSE 2-4 GM/100ML-% IV SOLN
INTRAVENOUS | Status: AC
  Filled 2021-08-12: qty 100

## 2021-08-12 MED ORDER — OXYCODONE HCL 5 MG PO TABS
ORAL_TABLET | ORAL | Status: AC
  Filled 2021-08-12: qty 1

## 2021-08-12 MED ORDER — KETOROLAC TROMETHAMINE 30 MG/ML IJ SOLN
INTRAMUSCULAR | Status: AC
  Filled 2021-08-12: qty 1

## 2021-08-12 MED ORDER — CEFAZOLIN SODIUM-DEXTROSE 2-4 GM/100ML-% IV SOLN
2.0000 g | INTRAVENOUS | Status: AC
  Administered 2021-08-12: 2 g via INTRAVENOUS

## 2021-08-12 MED ORDER — FENTANYL CITRATE (PF) 100 MCG/2ML IJ SOLN
INTRAMUSCULAR | Status: AC
  Filled 2021-08-12: qty 2

## 2021-08-12 MED ORDER — IOHEXOL 300 MG/ML  SOLN
INTRAMUSCULAR | Status: DC | PRN
  Administered 2021-08-12: 5 mL

## 2021-08-12 MED ORDER — LACTATED RINGERS IV SOLN: INTRAVENOUS | Status: DC

## 2021-08-12 MED ORDER — PROPOFOL 10 MG/ML IV BOLUS
INTRAVENOUS | Status: AC
Start: 2021-08-12 — End: ?
  Filled 2021-08-12: qty 20

## 2021-08-12 MED ORDER — OXYCODONE HCL 5 MG/5ML PO SOLN: 5.0000 mg | Freq: Once | ORAL | Status: AC | PRN

## 2021-08-12 MED ORDER — MIDAZOLAM HCL 5 MG/5ML IJ SOLN
INTRAMUSCULAR | Status: DC | PRN
  Administered 2021-08-12: 2 mg via INTRAVENOUS

## 2021-08-12 MED ORDER — 0.9 % SODIUM CHLORIDE (POUR BTL) OPTIME
TOPICAL | Status: DC | PRN
  Administered 2021-08-12: 500 mL

## 2021-08-12 MED ORDER — SODIUM CHLORIDE 0.9 % IR SOLN
Status: DC | PRN
  Administered 2021-08-12: 1500 mL

## 2021-08-12 MED ORDER — FENTANYL CITRATE (PF) 100 MCG/2ML IJ SOLN
25.0000 ug | INTRAMUSCULAR | Status: DC | PRN
  Administered 2021-08-12: 50 ug via INTRAVENOUS

## 2021-08-12 MED ORDER — OXYCODONE HCL 5 MG PO TABS
5.0000 mg | ORAL_TABLET | Freq: Once | ORAL | Status: AC | PRN
  Administered 2021-08-12: 5 mg via ORAL

## 2021-08-12 MED ORDER — MIDAZOLAM HCL 2 MG/2ML IJ SOLN
INTRAMUSCULAR | Status: AC
Start: 2021-08-12 — End: ?
  Filled 2021-08-12: qty 2

## 2021-08-12 MED ORDER — ONDANSETRON HCL 4 MG/2ML IJ SOLN
INTRAMUSCULAR | Status: DC | PRN
  Administered 2021-08-12: 4 mg via INTRAVENOUS

## 2021-08-12 MED ORDER — LIDOCAINE 2% (20 MG/ML) 5 ML SYRINGE
INTRAMUSCULAR | Status: DC | PRN
  Administered 2021-08-12: 100 mg via INTRAVENOUS

## 2021-08-12 MED ORDER — KETOROLAC TROMETHAMINE 30 MG/ML IJ SOLN: 30.0000 mg | Freq: Once | INTRAMUSCULAR | Status: DC | PRN

## 2021-08-12 MED ORDER — KETOROLAC TROMETHAMINE 30 MG/ML IJ SOLN
INTRAMUSCULAR | Status: DC | PRN
  Administered 2021-08-12: 30 mg via INTRAVENOUS

## 2021-08-12 MED ORDER — FENTANYL CITRATE (PF) 100 MCG/2ML IJ SOLN
INTRAMUSCULAR | Status: DC | PRN
  Administered 2021-08-12: 25 ug via INTRAVENOUS
  Administered 2021-08-12: 50 ug via INTRAVENOUS
  Administered 2021-08-12: 25 ug via INTRAVENOUS
  Administered 2021-08-12 (×2): 50 ug via INTRAVENOUS

## 2021-08-12 MED ORDER — ONDANSETRON HCL 4 MG/2ML IJ SOLN: 4.0000 mg | Freq: Once | INTRAMUSCULAR | Status: DC | PRN

## 2021-08-12 MED ORDER — PROPOFOL 10 MG/ML IV BOLUS
INTRAVENOUS | Status: DC | PRN
  Administered 2021-08-12 (×2): 200 mg via INTRAVENOUS

## 2021-08-12 MED ORDER — TRAMADOL HCL 50 MG PO TABS: 50.0000 mg | ORAL_TABLET | Freq: Four times a day (QID) | ORAL | 0 refills | Status: AC | PRN

## 2021-08-12 SURGICAL SUPPLY — 26 items
BAG DRAIN URO-CYSTO SKYTR STRL (DRAIN) ×2 IMPLANT
BAG DRN UROCATH (DRAIN) ×1
BASKET STONE 1.7 NGAGE (UROLOGICAL SUPPLIES) ×1 IMPLANT
BULB IRRIG PATHFIND (MISCELLANEOUS) IMPLANT
CATH URET 5FR 28IN OPEN ENDED (CATHETERS) ×2 IMPLANT
CLOTH BEACON ORANGE TIMEOUT ST (SAFETY) ×2 IMPLANT
EXTRACTOR STONE 1.7FRX115CM (UROLOGICAL SUPPLIES) IMPLANT
FIBER LASER FLEXIVA 365 (UROLOGICAL SUPPLIES) IMPLANT
GLOVE BIO SURGEON STRL SZ7.5 (GLOVE) ×2 IMPLANT
GOWN STRL REUS W/TWL XL LVL3 (GOWN DISPOSABLE) ×2 IMPLANT
GUIDEWIRE STR DUAL SENSOR (WIRE) ×2 IMPLANT
GUIDEWIRE ZIPWRE .038 STRAIGHT (WIRE) IMPLANT
IV NS 1000ML (IV SOLUTION) ×2
IV NS 1000ML BAXH (IV SOLUTION) ×1 IMPLANT
IV NS IRRIG 3000ML ARTHROMATIC (IV SOLUTION) ×2 IMPLANT
KIT TURNOVER CYSTO (KITS) ×2 IMPLANT
MANIFOLD NEPTUNE II (INSTRUMENTS) ×2 IMPLANT
NS IRRIG 500ML POUR BTL (IV SOLUTION) ×2 IMPLANT
PACK CYSTO (CUSTOM PROCEDURE TRAY) ×2 IMPLANT
STENT URET 6FRX26 CONTOUR (STENTS) ×1 IMPLANT
SYR 10ML LL (SYRINGE) ×2 IMPLANT
SYR 20ML LL LF (SYRINGE) ×2 IMPLANT
TRACTIP FLEXIVA PULS ID 200XHI (Laser) IMPLANT
TRACTIP FLEXIVA PULSE ID 200 (Laser) ×2
TUBE CONNECTING 12X1/4 (SUCTIONS) IMPLANT
TUBING UROLOGY SET (TUBING) ×2 IMPLANT

## 2021-08-12 NOTE — Op Note (Signed)
Preoperative diagnosis:  ?1.  Left ureteral calculus ? ?Postoperative diagnosis: ?1.  Left ureteral calculus ? ?Procedure(s): ?1.  Cystoscopy, left retrograde pyelogram with intraoperative interpretation, left ureteroscopy with laser lithotripsy of left ureteral calculus, stone extraction left JJ stent exchange ? ?Surgeon: Dr. Harold Barban ? ?Anesthesia: General ? ?Complications: None ? ?EBL: Minimal ? ?Specimens: ?Kidney stone ? ?Disposition of specimens: With patient ? ?Intraoperative findings: 6 to 7 mm calculus mid ureter at the crossing of the iliac vessels.  Manipulated stone distally and then utilized holmium laser to fragment into smaller fragments which were individually basketed and extracted.  No remaining stone fragments noted.  Patient had significant ureteral orifice edema and therefore 6 [place] by 26 cm Percuflex plus soft Contour stent was replaced leaving catheter to the urethral meatus. ? ?Indication: Patient is a 41 year old white male presented with obstructing left ureteral calculus with fever.  Underwent placement of urgent stent.  Now presents at this time to get a cystoscopy left ureteroscopy laser lithotripsy and stone extraction. ? ?Description of procedure: ? ?After obtaining informed consent for the patient is taken the major cystoscopy suite placed under general anesthesia.  He was placed in the dorsolithotomy position genitalia prepped and draped in usual sterile fashion.  Proper pause and timeout for site of procedure.  24 [place] the scope was advanced in the bladder without difficulty.  Left ureteral orifice was identified with left JJ stent in good position.  Remainder the bladder appeared grossly normal.  Stent was grasped and pulled just beyond the urethral meatus.  A sensor wire was then passed through the stent and manipulated up to the left renal pelvis and the stent was removed leaving the guidewire in place.  6.4 French semirigid ureteroscope was advanced over the guidewire  and manipulated inside the left ureteral orifice.  This was passed over the guidewire up to the level of the stone which was noted at the crossing of the iliac vessels.  The engage basket was utilized to entrap the stone and attempted to remove it however the ureteral orifice at edema would not allow stone to be extracted.  Subsequently utilized the holmium laser fiber 200 ?m fiber at a rate of 6 and power 0.6 to fragment the stone into 3 smaller pieces.  Care was taken not to injure the ureteral mucosa but the laser.  Engage basket was then utilized to extract the fragments without trauma to the ureter.  Final look back inside the ureter revealed no remaining stone fragments and the scope was advanced all the way along its length proximally with no stone fragments remaining.  Retrograde pyelogram through the ureteroscope confirmed no remaining stone fragments or filling defects.  Guidewire was in place up to the renal pelvis and the ureteroscope was removed leaving the guidewire in place.  This was back fed through the cystoscope and a 6 [place] by 26 cm Percuflex plus soft Contour stent was placed leaving a proximal coil in the renal pelvis and a distal coil in the bladder.  There was good flow of urine through and around the stent noted.  Nylon suture was left on the distal end of the stent and pulled just to the urethral meatus to facilitate future removal.  Stone fragments were collected and sent for analysis.  Procedure was terminated he was awakened from anesthesia and taken back to the recovery room in stable condition.  There is no immediate complication from the procedure. ? ? ? ? ?  ?

## 2021-08-12 NOTE — Anesthesia Procedure Notes (Signed)
Procedure Name: LMA Insertion ?Date/Time: 08/12/2021 1:16 PM ?Performed by: Jessica Priest, CRNA ?Pre-anesthesia Checklist: Patient identified, Emergency Drugs available, Suction available, Patient being monitored and Timeout performed ?Patient Re-evaluated:Patient Re-evaluated prior to induction ?Oxygen Delivery Method: Circle system utilized ?Preoxygenation: Pre-oxygenation with 100% oxygen ?Induction Type: IV induction ?Ventilation: Mask ventilation without difficulty ?LMA: LMA inserted ?LMA Size: 4.0 ?Number of attempts: 3 ?Airway Equipment and Method: Bite block ?Placement Confirmation: positive ETCO2, breath sounds checked- equal and bilateral and CO2 detector ?Tube secured with: Tape ?Dental Injury: Teeth and Oropharynx as per pre-operative assessment  ?Comments: LMA # 5 placed, unable to seat - replaced with LMA # 4 - suctioned - mask ventilated easily. LMA # 4 seated well - Dr Okey Dupre at bedside  ? ? ? ? ?

## 2021-08-12 NOTE — Anesthesia Preprocedure Evaluation (Signed)
Anesthesia Evaluation  ?Patient identified by MRN, date of birth, ID band ?Patient awake ? ? ? ?Reviewed: ?Allergy & Precautions, NPO status , Patient's Chart, lab work & pertinent test results ? ?Airway ?Mallampati: II ? ?TM Distance: >3 FB ?Neck ROM: Full ? ? ? Dental ?no notable dental hx. ? ?  ?Pulmonary ?Current Smoker,  ?  ?Pulmonary exam normal ?breath sounds clear to auscultation ? ? ? ? ? ? Cardiovascular ?negative cardio ROS ?Normal cardiovascular exam ?Rhythm:Regular Rate:Normal ? ? ?  ?Neuro/Psych ?negative neurological ROS ? negative psych ROS  ? GI/Hepatic ?negative GI ROS, Neg liver ROS,   ?Endo/Other  ?negative endocrine ROS ? Renal/GU ?negative Renal ROS  ?negative genitourinary ?  ?Musculoskeletal ?negative musculoskeletal ROS ?(+)  ? Abdominal ?  ?Peds ?negative pediatric ROS ?(+)  Hematology ?negative hematology ROS ?(+)   ?Anesthesia Other Findings ? ? Reproductive/Obstetrics ?negative OB ROS ? ?  ? ? ? ? ? ? ? ? ? ? ? ? ? ?  ?  ? ? ? ? ? ? ? ? ?Anesthesia Physical ?Anesthesia Plan ? ?ASA: 2 ? ?Anesthesia Plan: General  ? ?Post-op Pain Management: Minimal or no pain anticipated  ? ?Induction: Intravenous ? ?PONV Risk Score and Plan: 1 and Ondansetron and Treatment may vary due to age or medical condition ? ?Airway Management Planned: LMA ? ?Additional Equipment:  ? ?Intra-op Plan:  ? ?Post-operative Plan: Extubation in OR ? ?Informed Consent: I have reviewed the patients History and Physical, chart, labs and discussed the procedure including the risks, benefits and alternatives for the proposed anesthesia with the patient or authorized representative who has indicated his/her understanding and acceptance.  ? ? ? ?Dental advisory given ? ?Plan Discussed with: CRNA and Surgeon ? ?Anesthesia Plan Comments:   ? ? ? ? ? ? ?Anesthesia Quick Evaluation ? ?

## 2021-08-12 NOTE — Interval H&P Note (Signed)
History and Physical Interval Note: ? ?08/12/2021 ?12:40 PM ? ?Maurice Morales  has presented today for surgery, with the diagnosis of LEFT URETERAL STONE.  The various methods of treatment have been discussed with the patient and family. After consideration of risks, benefits and other options for treatment, the patient has consented to  Procedure(s) with comments: ?CYSTOSCOPY WITH RETROGRADE PYELOGRAM, URETEROSCOPY AND STENT PLACEMENT (Left) - 1 HR ?HOLMIUM LASER APPLICATION (Left) as a surgical intervention.  The patient's history has been reviewed, patient examined, no change in status, stable for surgery.  I have reviewed the patient's chart and labs.  Questions were answered to the patient's satisfaction.   ? ? ?Remi Haggard ? ? ?

## 2021-08-12 NOTE — Transfer of Care (Signed)
Immediate Anesthesia Transfer of Care Note ? ?Patient: Maurice Morales ? ?Procedure(s) Performed: Procedure(s) (LRB): ?CYSTOSCOPY WITH RETROGRADE PYELOGRAM, URETEROSCOPY AND STENT PLACEMENT (Left) ?HOLMIUM LASER APPLICATION (Left) ? ?Patient Location: PACU ? ?Anesthesia Type: General ? ?Level of Consciousness: awake, sedated, patient cooperative and responds to stimulation ? ?Airway & Oxygen Therapy: Patient Spontanous Breathing and Patient connected to [place] 02 and soft FM  ? ?Post-op Assessment: Report given to PACU RN, Post -op Vital signs reviewed and stable and Patient moving all extremities ? ?Post vital signs: Reviewed and stable ? ?Complications: No apparent anesthesia complications ?

## 2021-08-12 NOTE — Discharge Instructions (Addendum)
[facility] ?[id number] ?Post Ureteroscopy With or Without Stent Instructions ? ?Definitions: ? ?Ureter: The duct that transports urine from the kidney to the bladder. ?Stent:   A plastic hollow tube that is placed into the ureter, from the kidney to the bladder to prevent the ureter from swelling shut. ? ?GENERAL INSTRUCTIONS: ? ?Despite the fact that no skin incisions were used, the area around the ureter and bladder is raw and irritated. The stent is a foreign body which will further irritate the bladder wall. This irritation is manifested by increased frequency of urination, both day and night, and by an increase in the urge to urinate. In some, the urge to urinate is present almost always. Sometimes the urge is strong enough that you may not be able to stop yourself from urinating. The only real cure is to remove the stent and then give time for the bladder wall to heal which can't be done until the danger of the ureter swelling shut has passed, which varies. ? ?You may see some blood in your urine while the stent is in place and a few days afterwards. Do not be alarmed, even if the urine was clear for a while. Get off your feet and drink lots of fluids until clearing occurs. If you start to pass clots or don't improve, call us. ? ?DIET: ?You may return to your normal diet immediately. Because of the raw surface of your bladder, alcohol, spicy foods, acid type foods and drinks with caffeine may cause irritation or frequency and should be used in moderation. To keep your urine flowing freely and to avoid constipation, drink plenty of fluids during the day ( 8-10 glasses ). ?Tip: Avoid cranberry juice because it is very acidic. ? ?ACTIVITY: ?Your physical activity doesn't need to be restricted. However, if you are very active, you may see some blood in your urine. We suggest that you reduce your activity under these circumstances until the bleeding has stopped. ? ?BOWELS: ?It is important to  keep your bowels regular during the postoperative period. Straining with bowel movements can cause bleeding. A bowel movement every other day is reasonable. Use a mild laxative if needed, such as Milk of Magnesia 2-3 tablespoons, or 2 Dulcolax tablets. Call if you continue to have problems. If you have been taking narcotics for pain, before, during or after your surgery, you may be constipated. Take a laxative if necessary. ? ? ?MEDICATION: ?You should resume your pre-surgery medications unless told not to. In addition you will often be given an antibiotic to prevent infection. These should be taken as prescribed until the bottles are finished unless you are having an unusual reaction to one of the drugs. ? ?PROBLEMS YOU SHOULD REPORT TO [place]: ?Fevers over 100.5 Fahrenheit. ?Heavy bleeding, or clots ( See above notes about blood in urine ). ?Inability to urinate. ?Drug reactions ( hives, rash, nausea, vomiting, diarrhea ). ?Severe burning or pain with urination that is not improving. ? ?FOLLOW-UP: ?You will need a follow-up appointment to monitor your progress. Call for this appointment at the number listed above. Usually the first appointment will be about three to fourteen days after your surgery. ? ? ? ?  ?Post Anesthesia Home Care Instructions ? ?Activity: ?Get plenty of rest for the remainder of the day. A responsible individual must stay with you for 24 hours following the procedure.  ?For the next 24 hours, DO NOT: ?-Drive a car ?-Operate machinery ?-Drink alcoholic beverages ?-Take any medication unless instructed by  your physician ?-Make any legal decisions or sign important papers. ? ?Meals: ?Start with liquid foods such as gelatin or soup. Progress to regular foods as tolerated. Avoid greasy, spicy, heavy foods. If nausea and/or vomiting occur, drink only clear liquids until the nausea and/or vomiting subsides. Call your physician if vomiting continues. ? ?Special Instructions/Symptoms: ?Your throat may  feel dry or sore from the anesthesia or the breathing tube placed in your throat during surgery. If this causes discomfort, gargle with warm salt water. The discomfort should disappear within 24 hours. ? ?If you had a scopolamine patch placed behind your ear for the management of post- operative nausea and/or vomiting: ? ?1. The medication in the patch is effective for 72 hours, after which it should be removed.  Wrap patch in a tissue and discard in the trash. Wash hands thoroughly with soap and water. ?2. You may remove the patch earlier than 72 hours if you experience unpleasant side effects which may include dry mouth, dizziness or visual disturbances. ?3. Avoid touching the patch. Wash your hands with soap and water after contact with the patch. ?    ? ?No ibuprofen/Motrin/Advil products until 8pm or after ?

## 2021-08-13 ENCOUNTER — Encounter (HOSPITAL_BASED_OUTPATIENT_CLINIC_OR_DEPARTMENT_OTHER): Payer: Self-pay | Admitting: Urology

## 2021-08-13 NOTE — Anesthesia Postprocedure Evaluation (Signed)
Anesthesia Post Note ? ?Patient: Maurice Morales ? ?Procedure(s) Performed: CYSTOSCOPY WITH RETROGRADE PYELOGRAM, URETEROSCOPY AND STENT PLACEMENT (Left: Ureter) ?HOLMIUM LASER APPLICATION (Left: Ureter) ? ?  ? ?Patient location during evaluation: PACU ?Anesthesia Type: General ?Level of consciousness: awake and alert ?Pain management: pain level controlled ?Vital Signs Assessment: post-procedure vital signs reviewed and stable ?Respiratory status: spontaneous breathing, nonlabored ventilation, respiratory function stable and patient connected to nasal cannula oxygen ?Cardiovascular status: blood pressure returned to baseline and stable ?Postop Assessment: no apparent nausea or vomiting ?Anesthetic complications: no ? ? ?No notable events documented. ? ?Last Vitals:  ?Vitals:  ? 08/12/21 1445 08/12/21 1519  ?BP: 118/73 129/82  ?Pulse: (!) 57 69  ?Resp: 10 14  ?Temp: 36.6 ?C 36.6 ?C  ?SpO2: 97% 100%  ?  ?Last Pain:  ?Vitals:  ? 08/12/21 1445  ?TempSrc:   ?PainSc: 6   ? ? ?  ?  ?  ?  ?  ?  ? ?Dandra Shambaugh S ? ? ? ? ?

## 2023-06-15 ENCOUNTER — Ambulatory Visit (INDEPENDENT_AMBULATORY_CARE_PROVIDER_SITE_OTHER): Payer: Managed Care, Other (non HMO) | Admitting: Family Medicine

## 2023-06-15 ENCOUNTER — Encounter: Payer: Self-pay | Admitting: Family Medicine

## 2023-06-15 VITALS — BP 110/70 | HR 73 | Temp 98.1°F | Ht 75.0 in | Wt 268.0 lb

## 2023-06-15 DIAGNOSIS — E119 Type 2 diabetes mellitus without complications: Secondary | ICD-10-CM | POA: Diagnosis not present

## 2023-06-15 DIAGNOSIS — Z13220 Encounter for screening for lipoid disorders: Secondary | ICD-10-CM | POA: Diagnosis not present

## 2023-06-15 DIAGNOSIS — Z0000 Encounter for general adult medical examination without abnormal findings: Secondary | ICD-10-CM

## 2023-06-15 DIAGNOSIS — Z131 Encounter for screening for diabetes mellitus: Secondary | ICD-10-CM | POA: Diagnosis not present

## 2023-06-15 LAB — BASIC METABOLIC PANEL
BUN: 22 mg/dL (ref 6–23)
CO2: 28 meq/L (ref 19–32)
Calcium: 9.7 mg/dL (ref 8.4–10.5)
Chloride: 102 meq/L (ref 96–112)
Creatinine, Ser: 1.09 mg/dL (ref 0.40–1.50)
GFR: 82.29 mL/min (ref >60.00)
Glucose, Bld: 141 mg/dL — ABNORMAL HIGH (ref 70–99)
Potassium: 4.6 meq/L (ref 3.5–5.1)
Sodium: 138 meq/L (ref 135–145)

## 2023-06-15 LAB — CBC WITH DIFFERENTIAL/PLATELET
Basophils Absolute: 0 10*3/uL (ref 0.0–0.1)
Basophils Relative: 0.6 % (ref 0.0–3.0)
Eosinophils Absolute: 0.1 10*3/uL (ref 0.0–0.7)
Eosinophils Relative: 1.8 % (ref 0.0–5.0)
HCT: 48.4 % (ref 39.0–52.0)
Hemoglobin: 15.9 g/dL (ref 13.0–17.0)
Lymphocytes Relative: 21.1 % (ref 12.0–46.0)
Lymphs Abs: 1.7 10*3/uL (ref 0.7–4.0)
MCHC: 32.8 g/dL (ref 30.0–36.0)
MCV: 90.8 fl (ref 78.0–100.0)
Monocytes Absolute: 0.9 10*3/uL (ref 0.1–1.0)
Monocytes Relative: 11.8 % (ref 3.0–12.0)
Neutro Abs: 5.1 10*3/uL (ref 1.4–7.7)
Neutrophils Relative %: 64.7 % (ref 43.0–77.0)
Platelets: 351 10*3/uL (ref 150.0–400.0)
RBC: 5.33 Mil/uL (ref 4.22–5.81)
RDW: 13.3 % (ref 11.5–15.5)
WBC: 7.9 10*3/uL (ref 4.0–10.5)

## 2023-06-15 LAB — LIPID PANEL
Cholesterol: 203 mg/dL — ABNORMAL HIGH (ref 0–200)
HDL: 33.4 mg/dL — ABNORMAL LOW (ref >39.00)
LDL Cholesterol: 114 mg/dL — ABNORMAL HIGH (ref 0–99)
NonHDL: 169.15
Total CHOL/HDL Ratio: 6
Triglycerides: 278 mg/dL — ABNORMAL HIGH (ref 0.0–149.0)
VLDL: 55.6 mg/dL — ABNORMAL HIGH (ref 0.0–40.0)

## 2023-06-15 LAB — HEPATIC FUNCTION PANEL
ALT: 41 U/L (ref 0–53)
AST: 23 U/L (ref 0–37)
Albumin: 4.4 g/dL (ref 3.5–5.2)
Alkaline Phosphatase: 89 U/L (ref 39–117)
Bilirubin, Direct: 0.1 mg/dL (ref 0.0–0.3)
Total Bilirubin: 0.4 mg/dL (ref 0.2–1.2)
Total Protein: 7.5 g/dL (ref 6.0–8.3)

## 2023-06-15 LAB — TSH: TSH: 1.43 u[IU]/mL (ref 0.35–5.50)

## 2023-06-15 LAB — HEMOGLOBIN A1C: Hgb A1c MFr Bld: 7 % — ABNORMAL HIGH (ref 4.6–6.5)

## 2023-06-15 NOTE — Progress Notes (Signed)
 Subjective:    Patient ID: Maurice Morales, male    DOB: 03-Jun-1980, 43 y.o.   MRN: [id number]  HPI Here for a well exam. He feels well. His job used to be quite physical, and he dealt with chronic back pain. He recently changed to a different job that is much easier on his back.    Review of Systems  Constitutional: Negative.   HENT: Negative.    Eyes: Negative.   Respiratory: Negative.    Cardiovascular: Negative.   Gastrointestinal: Negative.   Genitourinary: Negative.   Musculoskeletal: Negative.   Skin: Negative.   Neurological: Negative.   Psychiatric/Behavioral: Negative.         Objective:   Physical Exam Constitutional:      General: He is not in acute distress.    Appearance: Normal appearance. He is well-developed. He is not diaphoretic.  HENT:     Head: Normocephalic and atraumatic.     Right Ear: External ear normal.     Left Ear: External ear normal.     Nose: Nose normal.     Mouth/Throat:     Pharynx: No oropharyngeal exudate.  Eyes:     General: No scleral icterus.       Right eye: No discharge.        Left eye: No discharge.     Conjunctiva/sclera: Conjunctivae normal.     Pupils: Pupils are equal, round, and reactive to light.  Neck:     Thyroid: No thyromegaly.     Vascular: No JVD.     Trachea: No tracheal deviation.  Cardiovascular:     Rate and Rhythm: Normal rate and regular rhythm.     Pulses: Normal pulses.     Heart sounds: Normal heart sounds. No murmur heard.    No friction rub. No gallop.  Pulmonary:     Effort: Pulmonary effort is normal. No respiratory distress.     Breath sounds: Normal breath sounds. No wheezing or rales.  Chest:     Chest wall: No tenderness.  Abdominal:     General: Bowel sounds are normal. There is no distension.     Palpations: Abdomen is soft. There is no mass.     Tenderness: There is no abdominal tenderness. There is no guarding or rebound.  Genitourinary:    Penis: Normal. No tenderness.       Testes: Normal.  Musculoskeletal:        General: No tenderness. Normal range of motion.     Cervical back: Neck supple.  Lymphadenopathy:     Cervical: No cervical adenopathy.  Skin:    General: Skin is warm and dry.     Coloration: Skin is not pale.     Findings: No erythema or rash.  Neurological:     General: No focal deficit present.     Mental Status: He is alert and oriented to person, place, and time.     Cranial Nerves: No cranial nerve deficit.     Motor: No abnormal muscle tone.     Coordination: Coordination normal.     Deep Tendon Reflexes: Reflexes are normal and symmetric. Reflexes normal.  Psychiatric:        Mood and Affect: Mood normal.        Behavior: Behavior normal.        Thought Content: Thought content normal.        Judgment: Judgment normal.           Assessment & Plan:  Well exam. We discussed diet and exercise. Get fasting labs. Per his request, we will set up his first colonoscopy.  Gershon Crane, MD

## 2023-06-16 MED ORDER — METFORMIN HCL 500 MG PO TABS: 500.0000 mg | ORAL_TABLET | Freq: Two times a day (BID) | ORAL | 5 refills | Status: DC

## 2023-06-16 NOTE — Addendum Note (Signed)
 Addended by: Johnella Moloney on: 06/16/2023 03:53 PM   Modules accepted: Orders

## 2023-06-22 ENCOUNTER — Encounter: Payer: Self-pay | Admitting: Gastroenterology

## 2023-07-23 ENCOUNTER — Ambulatory Visit: Admitting: *Deleted

## 2023-07-23 ENCOUNTER — Telehealth: Payer: Self-pay | Admitting: Gastroenterology

## 2023-07-23 VITALS — Ht 75.0 in | Wt 265.0 lb

## 2023-07-23 DIAGNOSIS — Z1211 Encounter for screening for malignant neoplasm of colon: Secondary | ICD-10-CM

## 2023-07-23 MED ORDER — SUTAB 1479-225-188 MG PO TABS: 12.0000 | ORAL_TABLET | ORAL | 0 refills | Status: DC

## 2023-07-23 MED ORDER — SUTAB 1479-225-188 MG PO TABS: ORAL_TABLET | ORAL | 0 refills | Status: DC

## 2023-07-23 NOTE — Addendum Note (Signed)
 Addended by: Danton Sewer on: 07/23/2023 03:56 PM   Modules accepted: Orders

## 2023-07-23 NOTE — Telephone Encounter (Signed)
 Pharmacy rep called to confirm direction on the prep medication order sent today.

## 2023-07-23 NOTE — Telephone Encounter (Signed)
 Spoke with Pharmacist and order to be corrected in # of pills.per request.

## 2023-07-23 NOTE — Progress Notes (Signed)
 Pt's name and DOB verified at the beginning of the pre-visit wit 2 identifiers  Permission given to speak with Pt denies any difficulty with ambulating,sitting, laying down or rolling side to side  Pt has no issues with ambulation   Pt has no issues moving head neck or swallowing  No egg or soy allergy known to patient   No issues known to pt with past sedation with any surgeries or procedures  Pt denies having issues being intubated  No FH of Malignant Hyperthermia  Pt is not on diet pills or shots  Pt is not on home 02   Pt is not on blood thinners   Pt denies issues with constipation   Pt is not on dialysis  Pt denise any abnormal heart rhythms   Pt denies any upcoming cardiac testing  Patient's chart reviewed by Cathlyn Parsons CNRA prior to pre-visit and patient appropriate for the [facility].  Pre-visit completed and red dot placed by patient's name on their procedure day (on provider's schedule).    Visit by phone  Pt states weight is 265 lb  IInstructions reviewed. Pt given , [facility] main # and MD on call # prior to instructions.  Pt states understanding of instructions. Instructed to review again prior to procedure. Pt states they will.

## 2023-08-06 ENCOUNTER — Encounter: Payer: Self-pay | Admitting: Gastroenterology

## 2023-08-13 ENCOUNTER — Encounter: Payer: Self-pay | Admitting: Gastroenterology

## 2023-08-13 ENCOUNTER — Ambulatory Visit (AMBULATORY_SURGERY_CENTER): Admitting: Gastroenterology

## 2023-08-13 VITALS — BP 136/84 | HR 58 | Temp 98.4°F | Resp 12 | Ht 75.0 in | Wt 265.0 lb

## 2023-08-13 DIAGNOSIS — D128 Benign neoplasm of rectum: Secondary | ICD-10-CM

## 2023-08-13 DIAGNOSIS — K621 Rectal polyp: Secondary | ICD-10-CM

## 2023-08-13 DIAGNOSIS — Z1211 Encounter for screening for malignant neoplasm of colon: Secondary | ICD-10-CM | POA: Diagnosis present

## 2023-08-13 DIAGNOSIS — K635 Polyp of colon: Secondary | ICD-10-CM

## 2023-08-13 DIAGNOSIS — D122 Benign neoplasm of ascending colon: Secondary | ICD-10-CM

## 2023-08-13 MED ORDER — SODIUM CHLORIDE 0.9 % IV SOLN: 500.0000 mL | Freq: Once | INTRAVENOUS | Status: DC

## 2023-08-13 NOTE — Progress Notes (Signed)
 Sedate, gd SR, tolerated procedure well, VSS, report to RN

## 2023-08-13 NOTE — Progress Notes (Signed)
 Pt's states no medical or surgical changes since previsit or office visit.

## 2023-08-13 NOTE — Progress Notes (Signed)
 [facility] History and Physical   Primary Care Physician:  Donley Furth, MD   Reason for Procedure:   Colon cancer screening  Plan:    Screening colonoscopy     HPI: SHANNE HAMS is a 43 y.o. male undergoing initial average risk screening colonoscopy.  He has no family history of colon cancer and no chronic GI symptoms.    Past Medical History:  Diagnosis Date   Chronic kidney disease    COVID-19 12/27/2019   Diabetes mellitus without complication (HCC)    History of kidney stones    Sepsis (HCC)    urosepsis and AKI due to left ureteral stone    Past Surgical History:  Procedure Laterality Date   CYSTOSCOPY W/ URETERAL STENT PLACEMENT Left 07/31/2021   Procedure: CYSTOSCOPY WITH RETROGRADE PYELOGRAM/URETERAL STENT PLACEMENT;  Surgeon: Sherlyn Ditto, MD;  Location: [facility];  Service: Urology;  Laterality: Left;   CYSTOSCOPY WITH RETROGRADE PYELOGRAM, URETEROSCOPY AND STENT PLACEMENT Left 08/12/2021   Procedure: CYSTOSCOPY WITH RETROGRADE PYELOGRAM, URETEROSCOPY AND STENT PLACEMENT;  Surgeon: Sherlyn Ditto, MD;  Location: [facility];  Service: Urology;  Laterality: Left;  1 HR   HOLMIUM LASER APPLICATION Left 08/12/2021   Procedure: HOLMIUM LASER APPLICATION;  Surgeon: Sherlyn Ditto, MD;  Location: [facility];  Service: Urology;  Laterality: Left;   LEG TENDON SURGERY Left 03/2021    Prior to Admission medications   Medication Sig Start Date End Date Taking? Authorizing Provider  Aspirin-Salicylamide-Caffeine (BC HEADACHE POWDER PO) Take 1-2 packets by mouth daily. Stopped taking on Friday the 28th for upcoming surgery on 08/12/21.   Yes [provider]  metFORMIN  (GLUCOPHAGE ) 500 MG tablet Take 1 tablet (500 mg total) by mouth 2 (two) times daily with a meal. 06/16/23  Yes Donley Furth, MD  cyclobenzaprine  (FLEXERIL ) 10 MG tablet Take 1 tablet (10 mg total) by mouth 3 (three) times daily as needed for muscle  spasms. 02/19/20   Donley Furth, MD  ibuprofen (ADVIL) 200 MG tablet Take 400 mg by mouth every 6 (six) hours as needed.    [provider]    Current Outpatient Medications  Medication Sig Dispense Refill   Aspirin-Salicylamide-Caffeine (BC HEADACHE POWDER PO) Take 1-2 packets by mouth daily. Stopped taking on Friday the 28th for upcoming surgery on 08/12/21.     metFORMIN  (GLUCOPHAGE ) 500 MG tablet Take 1 tablet (500 mg total) by mouth 2 (two) times daily with a meal. 60 tablet 5   cyclobenzaprine  (FLEXERIL ) 10 MG tablet Take 1 tablet (10 mg total) by mouth 3 (three) times daily as needed for muscle spasms. 60 tablet 5   ibuprofen (ADVIL) 200 MG tablet Take 400 mg by mouth every 6 (six) hours as needed.     Current Facility-Administered Medications  Medication Dose Route Frequency Provider Last Rate Last Admin   0.9 %  sodium chloride  infusion  500 mL Intravenous Once Elois Hair, MD        Allergies as of 08/13/2023   (No Known Allergies)    Family History  Problem Relation Age of Onset   COPD Father    COPD Paternal Uncle    Diabetes Maternal Grandmother    Colon polyps Maternal Grandfather    COPD Paternal Grandfather    Colon cancer Neg Hx    Esophageal cancer Neg Hx    Rectal cancer Neg Hx    Stomach cancer Neg Hx     Social History  Socioeconomic History   Marital status: Married    Spouse name: Not on file   Number of children: Not on file   Years of education: Not on file   Highest education level: Not on file  Occupational History   Not on file  Tobacco Use   Smoking status: Former    Current packs/day: 0.50    Average packs/day: 0.5 packs/day for 10.0 years (5.0 ttl pk-yrs)    Types: Cigarettes   Smokeless tobacco: Current    Types: Snuff  Vaping Use   Vaping status: Never Used  Substance and Sexual Activity   Alcohol use: Yes    Comment: occasional beer   Drug use: No   Sexual activity: Yes  Other Topics Concern   Not on file   Social History Narrative   Not on file   Social Drivers of Health   Financial Resource Strain: Not on file  Food Insecurity: Not on file  Transportation Needs: Not on file  Physical Activity: Not on file  Stress: Not on file  Social Connections: Not on file  Intimate Partner Violence: Not on file    Review of Systems:  All other review of systems negative except as mentioned in the HPI.  Physical Exam: Vital signs BP 127/79   Pulse 63   Temp 98.4 F (36.9 C) (Skin)   Ht 6\' 3"  (1.905 m)   Wt 265 lb (120.2 kg)   SpO2 95%   BMI 33.12 kg/m   General:   Alert,  Well-developed, well-nourished, pleasant and cooperative in NAD Airway:  Mallampati 2 Lungs:  Clear throughout to auscultation.   Heart:  Regular rate and rhythm; no murmurs, clicks, rubs,  or gallops. Abdomen:  Soft, nontender and nondistended. Normal bowel sounds.   Neuro/Psych:  Normal mood and affect. A and O x 3   Reino Lybbert E. Cherryl Corona, MD [facility]

## 2023-08-13 NOTE — Op Note (Signed)
 [facility] Patient Name: Maurice Morales Procedure Date: 08/13/2023 10:04 AM MRN: [id number] Endoscopist: Geralyn Knee E. Cherryl Corona , MD, [id number] Age: 43 Referring MD:  Date of Birth: 15-Jul-1980 Gender: Male Account #: [id number] Procedure:                Colonoscopy Indications:              Screening for colorectal malignant neoplasm, This                            is the patient's first colonoscopy Medicines:                Monitored Anesthesia Care Procedure:                Pre-Anesthesia Assessment:                           - Prior to the procedure, a History and Physical                            was performed, and patient medications and                            allergies were reviewed. The patient's tolerance of                            previous anesthesia was also reviewed. The risks                            and benefits of the procedure and the sedation                            options and risks were discussed with the patient.                            All questions were answered, and informed consent                            was obtained. Prior Anticoagulants: The patient has                            taken no anticoagulant or antiplatelet agents. ASA                            Grade Assessment: II - A patient with mild systemic                            disease. After reviewing the risks and benefits,                            the patient was deemed in satisfactory condition to                            undergo the procedure.  After obtaining informed consent, the colonoscope                            was passed under direct vision. Throughout the                            procedure, the patient's blood pressure, pulse, and                            oxygen saturations were monitored continuously. The                            CF [id number] #[id number] was introduced through the anus                            and advanced to  the the terminal ileum, with                            identification of the appendiceal orifice and IC                            valve. The colonoscopy was performed without                            difficulty. The patient tolerated the procedure                            well. The quality of the bowel preparation was                            good. The terminal ileum, ileocecal valve,                            appendiceal orifice, and rectum were photographed.                            The bowel preparation used was SUTAB  via split dose                            instruction. Scope In: 10:11:32 AM Scope Out: 10:27:17 AM Scope Withdrawal Time: 0 hours 13 minutes 54 seconds  Total Procedure Duration: 0 hours 15 minutes 45 seconds  Findings:                 The perianal and digital rectal examinations were                            normal. Pertinent negatives include normal                            sphincter tone and no palpable rectal lesions.                           A 7 mm polyp was found in the ascending colon. The  polyp was flat. The polyp was removed with a cold                            snare. Resection and retrieval were complete.                            Estimated blood loss was minimal.                           A 2 mm yellowish polyp was found in the distal                            rectum. The polyp was sessile. The polyp was                            removed with a cold snare. A small residual                            yellowish lesion was seen in the polypectomy site.                            This was then removed with forceps and placed in a                            separate jar. Resection and retrieval were                            complete. Estimated blood loss was minimal.                           The exam was otherwise normal throughout the                            examined colon.                           The  terminal ileum appeared normal.                           The retroflexed view of the distal rectum and anal                            verge was normal and showed no anal or rectal                            abnormalities. Complications:            No immediate complications. Estimated Blood Loss:     Estimated blood loss was minimal. Impression:               - One 7 mm polyp in the ascending colon, removed                            with a cold snare. Resected and retrieved.                           -  One 2 mm polyp in the distal rectum, removed with                            a cold snare and forceps. Resected and retrieved.                            Biopsied. This was somewhat suspicious for a rectal                            carcinoid.                           - The examined portion of the ileum was normal.                           - The distal rectum and anal verge are normal on                            retroflexion view. Recommendation:           - Patient has a contact number available for                            emergencies. The signs and symptoms of potential                            delayed complications were discussed with the                            patient. Return to normal activities tomorrow.                            Written discharge instructions were provided to the                            patient.                           - Resume previous diet.                           - Continue present medications.                           - Await pathology results.                           - Repeat colonoscopy (date not yet determined) for                            surveillance based on pathology results. Lisa Milian E. Cherryl Corona, MD 08/13/2023 10:37:34 AM This report has been signed electronically.

## 2023-08-13 NOTE — Patient Instructions (Signed)

## 2023-08-13 NOTE — Progress Notes (Signed)
 Called to room to assist during endoscopic procedure.  Patient ID and intended procedure confirmed with present staff. Received instructions for my participation in the procedure from the performing physician.

## 2023-08-17 LAB — SURGICAL PATHOLOGY

## 2023-08-21 ENCOUNTER — Encounter: Payer: Self-pay | Admitting: Gastroenterology

## 2023-08-21 NOTE — Progress Notes (Signed)
 Maurice Morales,  One polyp which I removed during your recent procedure was proven to be completely benign but is considered a "pre-cancerous" polyp that MAY have grown into cancer if it had not been removed.  The rectal polyp was not precancerous and did not show any evidence of carcinoid tumor.  Studies shows that at least 20% of women over age 45 and 30% of men over age 65 have pre-cancerous polyps.  Based on current nationally recognized surveillance guidelines, I recommend that you have a repeat colonoscopy in 7 years.   If you develop any new rectal bleeding, abdominal pain or significant bowel habit changes, please contact me before then.

## 2023-09-20 ENCOUNTER — Other Ambulatory Visit

## 2023-09-20 ENCOUNTER — Ambulatory Visit: Payer: Self-pay | Admitting: Family Medicine

## 2023-09-20 DIAGNOSIS — E119 Type 2 diabetes mellitus without complications: Secondary | ICD-10-CM

## 2023-09-20 LAB — HEMOGLOBIN A1C: Hgb A1c MFr Bld: 7.1 % — ABNORMAL HIGH (ref 4.6–6.5)

## 2023-09-22 ENCOUNTER — Telehealth: Payer: Self-pay

## 2023-09-22 ENCOUNTER — Other Ambulatory Visit: Payer: Self-pay

## 2023-09-22 DIAGNOSIS — E119 Type 2 diabetes mellitus without complications: Secondary | ICD-10-CM

## 2023-09-22 MED ORDER — METFORMIN HCL 1000 MG PO TABS: 1000.0000 mg | ORAL_TABLET | Freq: Two times a day (BID) | ORAL | 3 refills | Status: AC

## 2023-09-22 NOTE — Telephone Encounter (Signed)
 Copied from CRM [id number]. Topic: Clinical - Lab/Test Results >> Sep 22, 2023  3:17 PM Alysia Jumbo S wrote: Reason for CRM: Patient returning missed phone call, regarding lab results. Relayed results per providers note, verbatim. Patient verbalized understanding but has additional questions regarding medication change. He wants to know if a new prescription is being sent. Requested a callback.

## 2023-09-22 NOTE — Telephone Encounter (Signed)
 Left detailed message for pt regarding new Rx for Metformin  1000 mg, pt advised to pick up Rx from his pharmacy and to contact our office for a lab appointment  for recheck in 90 days

## 2023-09-27 NOTE — Telephone Encounter (Signed)
 Spoke with pt advised per Dr Alyne Babinski to schedule a lab app for A1C repeat, appointment scheduled

## 2023-12-27 ENCOUNTER — Ambulatory Visit: Payer: Self-pay | Admitting: Family Medicine

## 2023-12-27 ENCOUNTER — Other Ambulatory Visit

## 2023-12-27 DIAGNOSIS — E119 Type 2 diabetes mellitus without complications: Secondary | ICD-10-CM

## 2023-12-27 LAB — HEMOGLOBIN A1C: Hgb A1c MFr Bld: 7.6 % — ABNORMAL HIGH (ref 4.6–6.5)

## 2024-01-05 ENCOUNTER — Telehealth: Payer: Self-pay | Admitting: Family Medicine

## 2024-01-05 ENCOUNTER — Other Ambulatory Visit: Payer: Self-pay

## 2024-01-05 MED ORDER — GLIPIZIDE 10 MG PO TABS: 10.0000 mg | ORAL_TABLET | Freq: Two times a day (BID) | ORAL | 3 refills | Status: AC

## 2024-01-05 MED ORDER — SITAGLIPTIN PHOSPHATE 100 MG PO TABS
100.0000 mg | ORAL_TABLET | Freq: Every day | ORAL | 3 refills | Status: AC
Start: 2024-01-05 — End: ?

## 2024-01-05 NOTE — Progress Notes (Signed)
 Left voicemail for patient to return my call.

## 2024-01-05 NOTE — Telephone Encounter (Signed)
 As noted

## 2024-01-05 NOTE — Addendum Note (Signed)
 Addended by: DIONISIO CAMELIA PARAS on: 01/05/2024 10:17 AM   Modules accepted: Orders

## 2024-01-05 NOTE — Telephone Encounter (Signed)
 Spoke with patient regarding lab results and recommendations. Patient stated he has not been taking the Metformin  1000mg  as directed due to side effects of medication. Patient states he has very bad stomach discomfort, bloating, and he cannot take the side effects. Patient would like to know if there is an alternative to Metformin . Patient states he done well on 500mg  of Metformin  prior to changes.

## 2024-03-27 ENCOUNTER — Encounter: Payer: Self-pay | Admitting: Family Medicine

## 2024-03-27 ENCOUNTER — Ambulatory Visit: Admitting: Family Medicine

## 2024-03-27 VITALS — BP 140/82 | HR 60 | Temp 97.7°F | Ht 75.0 in | Wt 282.0 lb

## 2024-03-27 DIAGNOSIS — E119 Type 2 diabetes mellitus without complications: Secondary | ICD-10-CM

## 2024-03-27 LAB — POCT GLYCOSYLATED HEMOGLOBIN (HGB A1C): HbA1c, POC (controlled diabetic range): 6.1 % (ref 0.0–7.0)

## 2024-03-27 NOTE — Progress Notes (Signed)
° °  Subjective:    Patient ID: Maurice Morales, male    DOB: 06-04-1980, 43 y.o.   MRN: [id number]  HPI Here to follow up on type 2 diabetes. In September his A1c was 7.6%, so we added Januvia  100 mg daily to his regime. He still takes Glipizide  10 mg BID. He had been taking Metformin  as well, but when we increase the dose of this to 1000 mg BID, he had to stop taking it due to diarrhea. He feels fine today. His A1c today is 6.1%.    Review of Systems  Constitutional: Negative.   Respiratory: Negative.    Cardiovascular: Negative.        Objective:   Physical Exam Constitutional:      Appearance: Normal appearance.  Cardiovascular:     Rate and Rhythm: Normal rate and regular rhythm.     Pulses: Normal pulses.     Heart sounds: Normal heart sounds.  Pulmonary:     Effort: Pulmonary effort is normal.     Breath sounds: Normal breath sounds.  Neurological:     Mental Status: He is alert.           Assessment & Plan:  His type 2 diabetes is well controlled now. He will stay off Metformin .  Garnette Olmsted, MD
